# Patient Record
Sex: Male | Born: 1985
Health system: Southern US, Community
[De-identification: ages and names within clinical notes are randomized; demographics above are authoritative.]

## PROBLEM LIST (undated history)

## (undated) DIAGNOSIS — K5792 Diverticulitis of intestine, part unspecified, without perforation or abscess without bleeding: Secondary | ICD-10-CM

## (undated) HISTORY — PX: NO PAST SURGERIES: SHX2092

## (undated) HISTORY — PX: NOSE SURGERY: SHX723

---
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
## 2007-04-19 ENCOUNTER — Emergency Department (HOSPITAL_COMMUNITY): Admission: EM | Admit: 2007-04-19 | Discharge: 2007-04-19 | Payer: Self-pay | Admitting: Emergency Medicine

## 2010-11-10 NOTE — Op Note (Signed)
NAME:  Matthew English, Matthew English NO.:  [id number]   MEDICAL RECORD NO.:  [id number]                   PATIENT TYPE:  AMB   LOCATION:  [facility]                                 FACILITY:  [facility]   PHYSICIAN:  Thyra Breed, MD                      DATE OF BIRTH:  [birth year]   DATE OF PROCEDURE:  01/11/2004  DATE OF DISCHARGE:                                 OPERATIVE REPORT   PREOPERATIVE DIAGNOSIS:  Phimosis.   POSTOPERATIVE DIAGNOSIS:  Phimosis.   OPERATION PERFORMED:  Circumcision.   SURGEON:  Jamison Neighbor, M.D.   RESIDENT SURGEON:  Thyra Breed, MD   ANESTHESIA:  General endotracheal.   COMPLICATIONS:  None.   INDICATIONS FOR PROCEDURE:  The patient is a 25 year old male who was  recently seen in June of 2005 for severe paraphimosis requiring reduction in  the office with a penile block.  The patient is at this time unable to  retract his foreskin and is having discomfort with urination.  Therefore he  has been counseled on the risks, benefits and alternatives of undergoing a  circumcision.  Informed consent has been obtained.   DESCRIPTION OF PROCEDURE:  Following identification by his arm bracelet, the  patient was brought to the operating room and placed in supine position.  He  received preoperative intravenous antibiotics and underwent successful  induction of general endotracheal anesthesia.  His genitalia were then  prepped with Betadine and then draped in the usual sterile fashion.  The  phimotic foreskin could initially not be reduced.  Therefore, a hemostat was  placed in the dorsal midline of the foreskin and incised to create a dorsal  slit.  This allowed delivery of the glans and the first circumcising  incision was then made approximately 5 to 8 mm below the corona of the  glans.  Once this was complete, the foreskin was then reapproximated over  the glans and then a second incision made to allow sufficient length of the  penile shaft skin  once the shaft skin was reapproximated.  We then placed  four hemostats on the intervening piece of penile shaft skin.  Metzenbaum  scissors were used to sharply incise the skin bridge.  Bovie electrocautery  was then used to remove the intervening piece of skin in its entirety.  We  then used the Bovie to obtain excellent hemostasis on the penile shaft.  Once this was complete, we used 4-0 chromic suture to reapproximate the  dorsal midline.  We then placed a U-shaped holding stitch to reapproximate  the ventral midline shaft skin to the frenulum.  We then performed  frenuloplasty, reapproximating the frenulum using a running 4-0 chromic  suture.  Following this we placed two additional 4-0 chromic interrupted  sutures to reapproximate the lateral aspects of the circumcising incision.  The intervening areas of the reapproximation were closed using running 4-0  chromic suture.  The incision was then washed and dried.  20 mL of 0.25%  Marcaine were used as a penile block.  We then placed Xeroform gauze on the  circumferential suture line.  This was completed with a rolling 4 x 4  covered by Coban dressing.  The patient tolerated the procedure well.  There  were no complications.  Please note that Dr. Logan Bores was present and  participated in the entire procedure as he was the responsible surgeon.   DISPOSITION:  After waking from general anesthesia, the patient was  transported to the post anesthesia care unit in stable condition.                                               Thyra Breed, MD    EG/MEDQ  D:  01/11/2004  T:  01/11/2004  Job:  [id number]

## 2013-11-06 ENCOUNTER — Emergency Department (HOSPITAL_COMMUNITY): Payer: No Typology Code available for payment source

## 2013-11-06 ENCOUNTER — Emergency Department (HOSPITAL_COMMUNITY)
Admission: EM | Admit: 2013-11-06 | Discharge: 2013-11-06 | Disposition: A | Discharge status: Home / Self Care | Payer: No Typology Code available for payment source | Attending: Emergency Medicine | Admitting: Emergency Medicine

## 2013-11-06 ENCOUNTER — Encounter (HOSPITAL_COMMUNITY): Payer: Self-pay | Admitting: Emergency Medicine

## 2013-11-06 ENCOUNTER — Emergency Department (HOSPITAL_COMMUNITY)
Admission: EM | Admit: 2013-11-06 | Discharge: 2013-11-06 | Disposition: A | Discharge status: Home / Self Care | Payer: No Typology Code available for payment source | Source: Home / Self Care | Attending: Emergency Medicine | Admitting: Emergency Medicine

## 2013-11-06 DIAGNOSIS — Y92410 Unspecified street and highway as the place of occurrence of the external cause: Secondary | ICD-10-CM | POA: Insufficient documentation

## 2013-11-06 DIAGNOSIS — Y9389 Activity, other specified: Secondary | ICD-10-CM | POA: Insufficient documentation

## 2013-11-06 DIAGNOSIS — R42 Dizziness and giddiness: Secondary | ICD-10-CM | POA: Insufficient documentation

## 2013-11-06 DIAGNOSIS — S0990XA Unspecified injury of head, initial encounter: Secondary | ICD-10-CM | POA: Insufficient documentation

## 2013-11-06 MED ORDER — NAPROXEN 500 MG PO TABS: 500.0000 mg | ORAL_TABLET | Freq: Two times a day (BID) | ORAL | Status: DC

## 2013-11-06 MED ORDER — HYDROCODONE-ACETAMINOPHEN 5-325 MG PO TABS: 1.0000 | ORAL_TABLET | Freq: Four times a day (QID) | ORAL | Status: DC | PRN
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
## 2013-11-07 NOTE — ED Provider Notes (Signed)
Medical screening examination/treatment/procedure(s) were performed by non-physician practitioner and as supervising physician I was immediately available for consultation/collaboration.  Flint MelterElliott L Saivon Prowse, MD 11/07/13 [phone]

## 2013-11-09 NOTE — ED Provider Notes (Signed)
Medical screening examination/treatment/procedure(s) were performed by non-physician practitioner and as supervising physician I was immediately available for consultation/collaboration.   EKG Interpretation None       Doug SouSam Donevan Biller, MD 11/09/13 [phone]

## 2015-10-16 ENCOUNTER — Emergency Department (HOSPITAL_COMMUNITY): Payer: No Typology Code available for payment source

## 2015-10-16 ENCOUNTER — Encounter (HOSPITAL_COMMUNITY): Payer: Self-pay | Admitting: Emergency Medicine

## 2015-10-16 ENCOUNTER — Observation Stay (HOSPITAL_COMMUNITY)
Admission: EM | Admit: 2015-10-16 | Discharge: 2015-10-18 | Disposition: A | Discharge status: Home / Self Care | Payer: No Typology Code available for payment source | Attending: General Surgery | Admitting: General Surgery

## 2015-10-16 DIAGNOSIS — M79641 Pain in right hand: Secondary | ICD-10-CM | POA: Insufficient documentation

## 2015-10-16 DIAGNOSIS — S2231XA Fracture of one rib, right side, initial encounter for closed fracture: Secondary | ICD-10-CM | POA: Diagnosis not present

## 2015-10-16 DIAGNOSIS — S022XXA Fracture of nasal bones, initial encounter for closed fracture: Secondary | ICD-10-CM | POA: Insufficient documentation

## 2015-10-16 DIAGNOSIS — S0181XA Laceration without foreign body of other part of head, initial encounter: Secondary | ICD-10-CM

## 2015-10-16 DIAGNOSIS — S02401A Maxillary fracture, unspecified, initial encounter for closed fracture: Secondary | ICD-10-CM | POA: Insufficient documentation

## 2015-10-16 DIAGNOSIS — S0292XA Unspecified fracture of facial bones, initial encounter for closed fracture: Secondary | ICD-10-CM

## 2015-10-16 DIAGNOSIS — S0121XA Laceration without foreign body of nose, initial encounter: Secondary | ICD-10-CM | POA: Diagnosis not present

## 2015-10-16 DIAGNOSIS — M899 Disorder of bone, unspecified: Secondary | ICD-10-CM | POA: Insufficient documentation

## 2015-10-16 DIAGNOSIS — F10129 Alcohol abuse with intoxication, unspecified: Secondary | ICD-10-CM | POA: Insufficient documentation

## 2015-10-16 DIAGNOSIS — S0992XA Unspecified injury of nose, initial encounter: Secondary | ICD-10-CM | POA: Diagnosis present

## 2015-10-16 DIAGNOSIS — S301XXA Contusion of abdominal wall, initial encounter: Secondary | ICD-10-CM | POA: Insufficient documentation

## 2015-10-16 DIAGNOSIS — F10920 Alcohol use, unspecified with intoxication, uncomplicated: Secondary | ICD-10-CM

## 2015-10-16 DIAGNOSIS — F10929 Alcohol use, unspecified with intoxication, unspecified: Secondary | ICD-10-CM | POA: Diagnosis present

## 2015-10-16 LAB — COMPREHENSIVE METABOLIC PANEL
ALT: 49 U/L (ref 17–63)
AST: 8 U/L — ABNORMAL LOW (ref 15–41)
Albumin: 3.8 g/dL (ref 3.5–5.0)
Alkaline Phosphatase: 56 U/L (ref 38–126)
Anion gap: 11 (ref 5–15)
BUN: 14 mg/dL (ref 6–20)
CALCIUM: 9 mg/dL (ref 8.9–10.3)
CO2: 23 mmol/L (ref 22–32)
CREATININE: 1.15 mg/dL (ref 0.61–1.24)
Chloride: 108 mmol/L (ref 101–111)
Glucose, Bld: 99 mg/dL (ref 65–99)
Potassium: 3.7 mmol/L (ref 3.5–5.1)
Sodium: 142 mmol/L (ref 135–145)
TOTAL PROTEIN: 6.7 g/dL (ref 6.5–8.1)
Total Bilirubin: 0.5 mg/dL (ref 0.3–1.2)

## 2015-10-16 LAB — CBC WITH DIFFERENTIAL/PLATELET
BASOS PCT: 0 %
Basophils Absolute: 0 10*3/uL (ref 0.0–0.1)
EOS ABS: 0.1 10*3/uL (ref 0.0–0.7)
Eosinophils Relative: 2 %
HEMATOCRIT: 40.5 % (ref 39.0–52.0)
Hemoglobin: 13.2 g/dL (ref 13.0–17.0)
Lymphocytes Relative: 39 %
Lymphs Abs: 2.4 10*3/uL (ref 0.7–4.0)
MCH: 27.3 pg (ref 26.0–34.0)
MCHC: 32.6 g/dL (ref 30.0–36.0)
MCV: 83.9 fL (ref 78.0–100.0)
MONO ABS: 0.2 10*3/uL (ref 0.1–1.0)
MONOS PCT: 4 %
Neutro Abs: 3.4 10*3/uL (ref 1.7–7.7)
Neutrophils Relative %: 55 %
Platelets: 198 10*3/uL (ref 150–400)
RBC: 4.83 MIL/uL (ref 4.22–5.81)
RDW: 12.6 % (ref 11.5–15.5)
WBC: 6.2 10*3/uL (ref 4.0–10.5)

## 2015-10-16 LAB — CREATININE, SERUM
CREATININE: 0.93 mg/dL (ref 0.61–1.24)
GFR calc Af Amer: >60 mL/min (ref >60)

## 2015-10-16 LAB — RAPID URINE DRUG SCREEN, HOSP PERFORMED
Amphetamines: NOT DETECTED
BARBITURATES: NOT DETECTED
Benzodiazepines: NOT DETECTED
Cocaine: NOT DETECTED
OPIATES: POSITIVE — AB
Tetrahydrocannabinol: POSITIVE — AB

## 2015-10-16 LAB — CBC
HCT: 39.6 % (ref 39.0–52.0)
Hemoglobin: 13 g/dL (ref 13.0–17.0)
MCH: 27.6 pg (ref 26.0–34.0)
MCHC: 32.8 g/dL (ref 30.0–36.0)
MCV: 84.1 fL (ref 78.0–100.0)
Platelets: 188 10*3/uL (ref 150–400)
RBC: 4.71 MIL/uL (ref 4.22–5.81)
RDW: 12.6 % (ref 11.5–15.5)
WBC: 7.6 10*3/uL (ref 4.0–10.5)

## 2015-10-16 LAB — ETHANOL: ALCOHOL ETHYL (B): 117 mg/dL — AB (ref <5)

## 2015-10-16 LAB — APTT: aPTT: 27 seconds (ref 24–37)

## 2015-10-16 LAB — PROTIME-INR
INR: 1.17 (ref 0.00–1.49)
Prothrombin Time: 15.1 seconds (ref 11.6–15.2)

## 2015-10-16 MED ORDER — ENOXAPARIN SODIUM 40 MG/0.4ML ~~LOC~~ SOLN
40.0000 mg | SUBCUTANEOUS | Status: DC
  Administered 2015-10-17 – 2015-10-18 (×2): 40 mg via SUBCUTANEOUS
  Filled 2015-10-16 (×2): qty 0.4

## 2015-10-16 MED ORDER — TETANUS-DIPHTH-ACELL PERTUSSIS 5-2.5-18.5 LF-MCG/0.5 IM SUSP
0.5000 mL | Freq: Once | INTRAMUSCULAR | Status: AC
  Administered 2015-10-16: 0.5 mL via INTRAMUSCULAR
  Filled 2015-10-16: qty 0.5

## 2015-10-16 MED ORDER — ACETAMINOPHEN 325 MG PO TABS: 650.0000 mg | ORAL_TABLET | ORAL | Status: DC | PRN

## 2015-10-16 MED ORDER — PANTOPRAZOLE SODIUM 40 MG IV SOLR
40.0000 mg | Freq: Every day | INTRAVENOUS | Status: DC
  Filled 2015-10-16: qty 40

## 2015-10-16 MED ORDER — MORPHINE SULFATE (PF) 4 MG/ML IV SOLN
4.0000 mg | Freq: Once | INTRAVENOUS | Status: AC
  Administered 2015-10-16: 4 mg via INTRAVENOUS
  Filled 2015-10-16: qty 1

## 2015-10-16 MED ORDER — SODIUM CHLORIDE 0.9 % IV BOLUS (SEPSIS)
1000.0000 mL | Freq: Once | INTRAVENOUS | Status: AC
  Administered 2015-10-16: 1000 mL via INTRAVENOUS

## 2015-10-16 MED ORDER — PANTOPRAZOLE SODIUM 40 MG PO TBEC
40.0000 mg | DELAYED_RELEASE_TABLET | Freq: Every day | ORAL | Status: DC
  Administered 2015-10-16 – 2015-10-18 (×3): 40 mg via ORAL
  Filled 2015-10-16 (×3): qty 1

## 2015-10-16 MED ORDER — KCL IN DEXTROSE-NACL 20-5-0.45 MEQ/L-%-% IV SOLN
INTRAVENOUS | Status: DC
  Administered 2015-10-16: 14:00:00 via INTRAVENOUS
  Filled 2015-10-16 (×2): qty 1000

## 2015-10-16 MED ORDER — IOPAMIDOL (ISOVUE-300) INJECTION 61%
100.0000 mL | Freq: Once | INTRAVENOUS | Status: AC | PRN
  Administered 2015-10-16: 100 mL via INTRAVENOUS

## 2015-10-16 MED ORDER — KETOROLAC TROMETHAMINE 30 MG/ML IJ SOLN
30.0000 mg | Freq: Three times a day (TID) | INTRAMUSCULAR | Status: DC | PRN
  Administered 2015-10-16 – 2015-10-17 (×2): 30 mg via INTRAVENOUS
  Filled 2015-10-16 (×2): qty 1

## 2015-10-16 MED ORDER — ONDANSETRON HCL 4 MG/2ML IJ SOLN
4.0000 mg | Freq: Once | INTRAMUSCULAR | Status: AC
  Administered 2015-10-16: 4 mg via INTRAVENOUS
  Filled 2015-10-16: qty 2

## 2015-10-16 MED ORDER — LIDOCAINE-EPINEPHRINE 1 %-1:100000 IJ SOLN
10.0000 mL | Freq: Once | INTRAMUSCULAR | Status: AC
  Administered 2015-10-16: 10 mL
  Filled 2015-10-16: qty 1

## 2015-10-16 MED ORDER — BISACODYL 10 MG RE SUPP: 10.0000 mg | Freq: Every day | RECTAL | Status: DC | PRN

## 2015-10-16 MED ORDER — DOCUSATE SODIUM 100 MG PO CAPS
100.0000 mg | ORAL_CAPSULE | Freq: Two times a day (BID) | ORAL | Status: DC
  Administered 2015-10-16 – 2015-10-18 (×4): 100 mg via ORAL
  Filled 2015-10-16 (×5): qty 1

## 2015-10-16 MED ORDER — ONDANSETRON HCL 4 MG/2ML IJ SOLN
4.0000 mg | Freq: Four times a day (QID) | INTRAMUSCULAR | Status: DC | PRN
  Administered 2015-10-17: 4 mg via INTRAVENOUS
  Filled 2015-10-16: qty 2

## 2015-10-16 MED ORDER — ONDANSETRON HCL 4 MG PO TABS: 4.0000 mg | ORAL_TABLET | Freq: Four times a day (QID) | ORAL | Status: DC | PRN

## 2015-10-16 MED ORDER — IOPAMIDOL (ISOVUE-300) INJECTION 61%
INTRAVENOUS | Status: AC
  Filled 2015-10-16: qty 100

## 2015-10-16 MED ORDER — OXYCODONE HCL 5 MG PO TABS
5.0000 mg | ORAL_TABLET | ORAL | Status: DC | PRN
  Administered 2015-10-16 – 2015-10-18 (×6): 10 mg via ORAL
  Filled 2015-10-16 (×8): qty 2

## 2015-10-16 MED ORDER — MORPHINE SULFATE (PF) 2 MG/ML IV SOLN
1.0000 mg | INTRAVENOUS | Status: DC | PRN
  Filled 2015-10-16: qty 1
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
## 2015-10-16 NOTE — ED Notes (Signed)
Pt c/o soreness on this left hand, increase pain whit palpation, MD to be notified.

## 2015-10-16 NOTE — ED Notes (Signed)
To be transported upstairs by Daphine DeutscherMartin, EMT

## 2015-10-16 NOTE — ED Notes (Signed)
MD Byers at the bedside.  

## 2015-10-16 NOTE — ED Notes (Signed)
Attempted Report x1.   

## 2015-10-16 NOTE — ED Notes (Signed)
Called Xray to transport patient.  

## 2015-10-16 NOTE — ED Notes (Signed)
Patient transported to X-ray 
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
## 2015-10-17 LAB — BASIC METABOLIC PANEL
Anion gap: 11 (ref 5–15)
BUN: 12 mg/dL (ref 6–20)
CHLORIDE: 104 mmol/L (ref 101–111)
CO2: 24 mmol/L (ref 22–32)
CREATININE: 1.06 mg/dL (ref 0.61–1.24)
Calcium: 8.8 mg/dL — ABNORMAL LOW (ref 8.9–10.3)
GFR calc Af Amer: >60 mL/min (ref >60)
GFR calc non Af Amer: >60 mL/min (ref >60)
GLUCOSE: 100 mg/dL — AB (ref 65–99)
POTASSIUM: 3.7 mmol/L (ref 3.5–5.1)
Sodium: 139 mmol/L (ref 135–145)

## 2015-10-17 LAB — SAMPLE TO BLOOD BANK

## 2015-10-17 LAB — CBC
HCT: 38.4 % — ABNORMAL LOW (ref 39.0–52.0)
HEMOGLOBIN: 12.6 g/dL — AB (ref 13.0–17.0)
MCH: 27.7 pg (ref 26.0–34.0)
MCHC: 32.8 g/dL (ref 30.0–36.0)
MCV: 84.4 fL (ref 78.0–100.0)
PLATELETS: 157 10*3/uL (ref 150–400)
RBC: 4.55 MIL/uL (ref 4.22–5.81)
RDW: 12.8 % (ref 11.5–15.5)
WBC: 5.1 10*3/uL (ref 4.0–10.5)

## 2015-10-17 MED ORDER — SALINE SPRAY 0.65 % NA SOLN
1.0000 | Freq: Two times a day (BID) | NASAL | Status: DC
  Administered 2015-10-17 – 2015-10-18 (×3): 1 via NASAL
  Filled 2015-10-17: qty 44
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
## 2015-10-18 ENCOUNTER — Encounter: Payer: Self-pay | Admitting: Orthopedic Surgery

## 2015-10-18 DIAGNOSIS — S2231XA Fracture of one rib, right side, initial encounter for closed fracture: Secondary | ICD-10-CM | POA: Diagnosis present

## 2015-10-18 DIAGNOSIS — S0181XA Laceration without foreign body of other part of head, initial encounter: Secondary | ICD-10-CM | POA: Diagnosis present

## 2015-10-18 DIAGNOSIS — S301XXA Contusion of abdominal wall, initial encounter: Secondary | ICD-10-CM | POA: Diagnosis present

## 2015-10-18 DIAGNOSIS — S0292XA Unspecified fracture of facial bones, initial encounter for closed fracture: Secondary | ICD-10-CM | POA: Diagnosis present

## 2015-10-18 DIAGNOSIS — F10929 Alcohol use, unspecified with intoxication, unspecified: Secondary | ICD-10-CM | POA: Diagnosis present

## 2015-10-18 MED ORDER — OXYCODONE-ACETAMINOPHEN 5-325 MG PO TABS: 1.0000 | ORAL_TABLET | ORAL | Status: DC | PRN

## 2015-10-18 MED ORDER — NAPROXEN 500 MG PO TABS: 500.0000 mg | ORAL_TABLET | Freq: Two times a day (BID) | ORAL | Status: DC

## 2015-10-18 MED ORDER — CEPHALEXIN 250 MG PO CAPS: 250.0000 mg | ORAL_CAPSULE | Freq: Four times a day (QID) | ORAL | Status: AC

## 2015-10-18 NOTE — Discharge Instructions (Signed)
No driving while taking oxycodone.  Use saline nasal spray (over-the-counter) in both nostrils.
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
## 2015-10-18 NOTE — Progress Notes (Signed)
Discharge home. Home discharge instruction given, no question verbalized. 
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
## 2016-12-06 ENCOUNTER — Emergency Department (HOSPITAL_COMMUNITY): Payer: Self-pay

## 2016-12-06 ENCOUNTER — Encounter (HOSPITAL_COMMUNITY): Payer: Self-pay | Admitting: *Deleted

## 2016-12-06 ENCOUNTER — Inpatient Hospital Stay (HOSPITAL_COMMUNITY)
Admission: EM | Admit: 2016-12-06 | Discharge: 2016-12-09 | DRG: 392 | Disposition: A | Discharge status: Home / Self Care | Payer: Self-pay | Attending: General Surgery | Admitting: General Surgery

## 2016-12-06 DIAGNOSIS — K5720 Diverticulitis of large intestine with perforation and abscess without bleeding: Principal | ICD-10-CM | POA: Diagnosis present

## 2016-12-06 DIAGNOSIS — K529 Noninfective gastroenteritis and colitis, unspecified: Secondary | ICD-10-CM | POA: Diagnosis present

## 2016-12-06 DIAGNOSIS — R300 Dysuria: Secondary | ICD-10-CM | POA: Diagnosis present

## 2016-12-06 DIAGNOSIS — K5900 Constipation, unspecified: Secondary | ICD-10-CM | POA: Diagnosis present

## 2016-12-06 HISTORY — DX: Diverticulitis of intestine, part unspecified, without perforation or abscess without bleeding: K57.92

## 2016-12-06 LAB — CBC
HEMATOCRIT: 41.5 % (ref 39.0–52.0)
Hemoglobin: 13.5 g/dL (ref 13.0–17.0)
MCH: 28 pg (ref 26.0–34.0)
MCHC: 32.5 g/dL (ref 30.0–36.0)
MCV: 86.1 fL (ref 78.0–100.0)
Platelets: 232 10*3/uL (ref 150–400)
RBC: 4.82 MIL/uL (ref 4.22–5.81)
RDW: 12.8 % (ref 11.5–15.5)
WBC: 6.8 10*3/uL (ref 4.0–10.5)

## 2016-12-06 LAB — COMPREHENSIVE METABOLIC PANEL
ALBUMIN: 3.8 g/dL (ref 3.5–5.0)
ALT: 32 U/L (ref 17–63)
AST: 17 U/L (ref 15–41)
Alkaline Phosphatase: 61 U/L (ref 38–126)
Anion gap: 8 (ref 5–15)
BUN: 13 mg/dL (ref 6–20)
CHLORIDE: 104 mmol/L (ref 101–111)
CO2: 27 mmol/L (ref 22–32)
Calcium: 9.1 mg/dL (ref 8.9–10.3)
Creatinine, Ser: 1 mg/dL (ref 0.61–1.24)
GFR calc Af Amer: >60 mL/min (ref >60)
GFR calc non Af Amer: >60 mL/min (ref >60)
GLUCOSE: 96 mg/dL (ref 65–99)
POTASSIUM: 4.1 mmol/L (ref 3.5–5.1)
SODIUM: 139 mmol/L (ref 135–145)
Total Bilirubin: 0.5 mg/dL (ref 0.3–1.2)
Total Protein: 6.8 g/dL (ref 6.5–8.1)

## 2016-12-06 LAB — URINALYSIS, ROUTINE W REFLEX MICROSCOPIC
Bilirubin Urine: NEGATIVE
GLUCOSE, UA: NEGATIVE mg/dL
Hgb urine dipstick: NEGATIVE
KETONES UR: NEGATIVE mg/dL
LEUKOCYTES UA: NEGATIVE
Nitrite: NEGATIVE
PH: 6 (ref 5.0–8.0)
Protein, ur: NEGATIVE mg/dL
Specific Gravity, Urine: 1.029 (ref 1.005–1.030)

## 2016-12-06 LAB — LIPASE, BLOOD: LIPASE: 29 U/L (ref 11–51)

## 2016-12-06 MED ORDER — ACETAMINOPHEN 325 MG PO TABS
650.0000 mg | ORAL_TABLET | Freq: Four times a day (QID) | ORAL | Status: DC | PRN
  Administered 2016-12-06 – 2016-12-07 (×3): 650 mg via ORAL
  Filled 2016-12-06 (×4): qty 2

## 2016-12-06 MED ORDER — IOPAMIDOL (ISOVUE-300) INJECTION 61%
INTRAVENOUS | Status: AC
  Administered 2016-12-06: 100 mL
  Filled 2016-12-06: qty 100

## 2016-12-06 MED ORDER — HYDRALAZINE HCL 20 MG/ML IJ SOLN: 10.0000 mg | INTRAMUSCULAR | Status: DC | PRN

## 2016-12-06 MED ORDER — METRONIDAZOLE IN NACL 5-0.79 MG/ML-% IV SOLN
500.0000 mg | Freq: Once | INTRAVENOUS | Status: AC
  Administered 2016-12-06: 500 mg via INTRAVENOUS
  Filled 2016-12-06: qty 100

## 2016-12-06 MED ORDER — ONDANSETRON 4 MG PO TBDP: 4.0000 mg | ORAL_TABLET | Freq: Four times a day (QID) | ORAL | Status: DC | PRN

## 2016-12-06 MED ORDER — ZOLPIDEM TARTRATE 5 MG PO TABS: 5.0000 mg | ORAL_TABLET | Freq: Every evening | ORAL | Status: DC | PRN

## 2016-12-06 MED ORDER — ACETAMINOPHEN 650 MG RE SUPP: 650.0000 mg | Freq: Four times a day (QID) | RECTAL | Status: DC | PRN

## 2016-12-06 MED ORDER — KCL IN DEXTROSE-NACL 20-5-0.45 MEQ/L-%-% IV SOLN
INTRAVENOUS | Status: DC
Start: 2016-12-06 — End: 2016-12-09
  Administered 2016-12-06 – 2016-12-08 (×4): via INTRAVENOUS
  Filled 2016-12-06 (×4): qty 1000

## 2016-12-06 MED ORDER — LEVOFLOXACIN IN D5W 750 MG/150ML IV SOLN
750.0000 mg | Freq: Once | INTRAVENOUS | Status: AC
  Administered 2016-12-06: 750 mg via INTRAVENOUS
  Filled 2016-12-06: qty 150

## 2016-12-06 MED ORDER — SIMETHICONE 80 MG PO CHEW
40.0000 mg | CHEWABLE_TABLET | Freq: Four times a day (QID) | ORAL | Status: DC | PRN
  Administered 2016-12-06 – 2016-12-09 (×7): 40 mg via ORAL
  Filled 2016-12-06 (×9): qty 1

## 2016-12-06 MED ORDER — DIPHENHYDRAMINE HCL 50 MG/ML IJ SOLN: 12.5000 mg | Freq: Four times a day (QID) | INTRAMUSCULAR | Status: DC | PRN

## 2016-12-06 MED ORDER — DIPHENHYDRAMINE HCL 12.5 MG/5ML PO ELIX: 12.5000 mg | ORAL_SOLUTION | Freq: Four times a day (QID) | ORAL | Status: DC | PRN

## 2016-12-06 MED ORDER — DEXTROSE 5 % IV SOLN
2.0000 g | INTRAVENOUS | Status: DC
  Administered 2016-12-06 – 2016-12-08 (×3): 2 g via INTRAVENOUS
  Filled 2016-12-06 (×4): qty 2

## 2016-12-06 MED ORDER — MORPHINE SULFATE (PF) 4 MG/ML IV SOLN
1.0000 mg | INTRAVENOUS | Status: DC | PRN
  Administered 2016-12-06 – 2016-12-07 (×3): 1 mg via INTRAVENOUS
  Administered 2016-12-07: 2 mg via INTRAVENOUS
  Administered 2016-12-08: 4 mg via INTRAVENOUS
  Administered 2016-12-08: 2 mg via INTRAVENOUS
  Filled 2016-12-06 (×8): qty 1

## 2016-12-06 MED ORDER — ENOXAPARIN SODIUM 40 MG/0.4ML ~~LOC~~ SOLN
40.0000 mg | SUBCUTANEOUS | Status: DC
  Administered 2016-12-06 – 2016-12-08 (×3): 40 mg via SUBCUTANEOUS
  Filled 2016-12-06 (×3): qty 0.4

## 2016-12-06 MED ORDER — ONDANSETRON HCL 4 MG/2ML IJ SOLN
4.0000 mg | Freq: Four times a day (QID) | INTRAMUSCULAR | Status: DC | PRN
  Administered 2016-12-08 (×2): 4 mg via INTRAVENOUS
  Filled 2016-12-06 (×2): qty 2

## 2016-12-06 MED ORDER — METRONIDAZOLE IN NACL 5-0.79 MG/ML-% IV SOLN
500.0000 mg | Freq: Three times a day (TID) | INTRAVENOUS | Status: DC
  Administered 2016-12-06 – 2016-12-09 (×9): 500 mg via INTRAVENOUS
  Filled 2016-12-06 (×10): qty 100

## 2016-12-06 MED ORDER — METHOCARBAMOL 1000 MG/10ML IJ SOLN: 500.0000 mg | Freq: Three times a day (TID) | INTRAVENOUS | Status: DC | PRN

## 2016-12-06 NOTE — Progress Notes (Signed)
Case reviewed with Dr. Grace IsaacWatts regarding request for possible drain placement in this patient with diverticulitis and an abscess.  Unfortunately this area right now is too small to drain and possible intramural.  We recommend IV abx therapy with a low threshold to repeat a CT scan in several days to evaluate for abscess progression and possible need for drain at that time.  This was relayed to Dr. Donell BeersByerly who was in agreement.  Batoul Limes E 2:21 PM 12/06/2016

## 2016-12-06 NOTE — ED Triage Notes (Signed)
Pt c/o abdominal pain (just below umbilicus) x 1 week with intermittent pain shooting to penis. Denies NV; pain in abd worsens when lying flat. Has tried miralax without improvement
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
## 2016-12-06 NOTE — ED Notes (Signed)
Returned from CT.
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
## 2016-12-07 LAB — CBC
HEMATOCRIT: 39.7 % (ref 39.0–52.0)
HEMOGLOBIN: 12.9 g/dL — AB (ref 13.0–17.0)
MCH: 27.6 pg (ref 26.0–34.0)
MCHC: 32.5 g/dL (ref 30.0–36.0)
MCV: 85 fL (ref 78.0–100.0)
Platelets: 205 10*3/uL (ref 150–400)
RBC: 4.67 MIL/uL (ref 4.22–5.81)
RDW: 12.8 % (ref 11.5–15.5)
WBC: 5.9 10*3/uL (ref 4.0–10.5)

## 2016-12-07 LAB — BASIC METABOLIC PANEL
ANION GAP: 7 (ref 5–15)
BUN: 8 mg/dL (ref 6–20)
CALCIUM: 9 mg/dL (ref 8.9–10.3)
CO2: 26 mmol/L (ref 22–32)
Chloride: 105 mmol/L (ref 101–111)
Creatinine, Ser: 0.9 mg/dL (ref 0.61–1.24)
GFR calc Af Amer: >60 mL/min (ref >60)
Glucose, Bld: 95 mg/dL (ref 65–99)
POTASSIUM: 3.8 mmol/L (ref 3.5–5.1)
SODIUM: 138 mmol/L (ref 135–145)

## 2016-12-07 LAB — HIV ANTIBODY (ROUTINE TESTING W REFLEX): HIV SCREEN 4TH GENERATION: NONREACTIVE

## 2016-12-07 NOTE — Progress Notes (Signed)
Patient ID: Matthew English, male   DOB: 01/16/1986, 31 y.o.   MRN: [id number]  [facility]     Subjective: CC- diverticulitis, abdominal pain Patient states that he is feeling 80% better today. He reports some mild persistent lower abdominal pain, but it is improved from yesterday. Dysuria resolved. Denies n/v.  Objective: Vital signs in last 24 hours: Temp:  [98.4 F (36.9 C)-98.6 F (37 C)] 98.4 F (36.9 C) (06/15 0620) Pulse Rate:  [56-70] 65 (06/15 0620) Resp:  [14-18] 18 (06/15 0620) BP: (103-122)/(63-87) 108/64 (06/15 0620) SpO2:  [98 %-100 %] 100 % (06/15 0620) Weight:  [256 lb 13.4 oz (116.5 kg)] 256 lb 13.4 oz (116.5 kg) (06/14 1500) Last BM Date: 12/05/16  Intake/Output from previous day: 06/14 0701 - 06/15 0700 In: 1163.3 [I.V.:1013.3; IV Piggyback:150] Out: 300 [Urine:300] Intake/Output this shift: No intake/output data recorded.  PE: Gen:  Alert, NAD, pleasant HEENT: EOM's in tact, pupils equal  Card:  RRR, no M/G/R heard Pulm:  CTAB, no W/R/R, effort normal Abd: Soft, ND, minimal lower abdominal TTP, +BS, no HSM, no hernia Ext:  No erythema, edema, or tenderness BUE/BLE  Psych: A&Ox3  Lab Results:   Recent Labs  12/06/16 0600 12/07/16 0827  WBC 6.8 5.9  HGB 13.5 12.9*  HCT 41.5 39.7  PLT 232 205   BMET  Recent Labs  12/06/16 0600 12/07/16 0827  NA 139 138  K 4.1 3.8  CL 104 105  CO2 27 26  GLUCOSE 96 95  BUN 13 8  CREATININE 1.00 0.90  CALCIUM 9.1 9.0   PT/INR No results for input(s): LABPROT, INR in the last 72 hours. CMP     Component Value Date/Time   NA 138 12/07/2016 0827   K 3.8 12/07/2016 0827   CL 105 12/07/2016 0827   CO2 26 12/07/2016 0827   GLUCOSE 95 12/07/2016 0827   BUN 8 12/07/2016 0827   CREATININE 0.90 12/07/2016 0827   CALCIUM 9.0 12/07/2016 0827   PROT 6.8 12/06/2016 0600   ALBUMIN 3.8 12/06/2016 0600   AST 17 12/06/2016 0600   ALT 32 12/06/2016 0600   ALKPHOS 61 12/06/2016 0600    BILITOT 0.5 12/06/2016 0600   GFRNONAA >60 12/07/2016 0827   GFRAA >60 12/07/2016 0827   Lipase     Component Value Date/Time   LIPASE 29 12/06/2016 0600       Studies/Results: Ct Abdomen Pelvis W Contrast  Result Date: 12/06/2016 CLINICAL DATA:  Lower abdominal pain for 1 week EXAM: CT ABDOMEN AND PELVIS WITH CONTRAST TECHNIQUE: Multidetector CT imaging of the abdomen and pelvis was performed using the standard protocol following bolus administration of intravenous contrast. CONTRAST:  100mL ISOVUE-300 IOPAMIDOL (ISOVUE-300) INJECTION 61% COMPARISON:  10/16/2015 FINDINGS: Lower chest:  No contributory findings. Hepatobiliary: No focal liver abnormality.No evidence of biliary obstruction or stone. Pancreas: Unremarkable. Spleen: Unremarkable. Adrenals/Urinary Tract: Negative adrenals. No hydronephrosis or convincing stone (early urinary contrast excretion). Unremarkable bladder. Stomach/Bowel: Focal inflammatory wall thickening at the level of the sigmoid colon with rim enhancing intramural and adjacent mesenteric collection containing fluid and gas, measuring 23 mm. Usually this appearance is from complicated diverticulitis. At least 1 additional diverticulum is seen along the distal transverse colon. No evidence of underlying foreign body. Vascular/Lymphatic: No acute vascular abnormality. No mass or adenopathy. Reproductive:No pathologic findings. Other: Trace reactive fluid.  No pneumoperitoneum. Musculoskeletal: No acute abnormalities. IMPRESSION: Focal sigmoid colitis with intramural and mesenteric 22 mm abscess. Usually, this appearance is from  complicated diverticulitis; at least 1 colonic diverticulum is seen elsewhere. No pneumoperitoneum or drainable collection. Electronically Signed   By: Marnee Spring M.D.   On: 12/06/2016 09:28    Anti-infectives: Anti-infectives    Start     Dose/Rate Route Frequency Ordered Stop   12/06/16 1830  metroNIDAZOLE (FLAGYL) IVPB 500 mg     500  mg 100 mL/hr over 60 Minutes Intravenous Every 8 hours 12/06/16 1324     12/06/16 1400  cefTRIAXone (ROCEPHIN) 2 g in dextrose 5 % 50 mL IVPB     2 g 100 mL/hr over 30 Minutes Intravenous Every 24 hours 12/06/16 1324     12/06/16 1015  levofloxacin (LEVAQUIN) IVPB 750 mg     750 mg 100 mL/hr over 90 Minutes Intravenous  Once 12/06/16 1003 12/06/16 1356   12/06/16 1015  metroNIDAZOLE (FLAGYL) IVPB 500 mg     500 mg 100 mL/hr over 60 Minutes Intravenous  Once 12/06/16 1003 12/06/16 1156       Assessment/Plan Sigmoid diverticulitis with abscess - CT 6/14 showed focal sigmoid colitis with intramural and mesenteric 22 mm abscess - abscess too small for per drain - patient is on rocephin/flagyl  ID - rocephin/flagyl 6/14>> FEN - IVF, clear liquids VTE - SCDs, lovenox  Plan - advance to clear liquids. Continue IV antibiotics.   LOS: 1 day    Edson Snowball , [facility] 12/07/2016, 9:52 AM Pager: [phone] Consults: [phone] Mon-Fri 7:00 am-4:30 pm Sat-Sun 7:00 am-11:30 am

## 2016-12-08 LAB — CBC
HEMATOCRIT: 41.1 % (ref 39.0–52.0)
HEMOGLOBIN: 13.4 g/dL (ref 13.0–17.0)
MCH: 27.6 pg (ref 26.0–34.0)
MCHC: 32.6 g/dL (ref 30.0–36.0)
MCV: 84.7 fL (ref 78.0–100.0)
Platelets: 221 10*3/uL (ref 150–400)
RBC: 4.85 MIL/uL (ref 4.22–5.81)
RDW: 12.5 % (ref 11.5–15.5)
WBC: 7.1 10*3/uL (ref 4.0–10.5)

## 2016-12-08 LAB — BASIC METABOLIC PANEL
ANION GAP: 6 (ref 5–15)
BUN: 6 mg/dL (ref 6–20)
CO2: 27 mmol/L (ref 22–32)
Calcium: 9 mg/dL (ref 8.9–10.3)
Chloride: 102 mmol/L (ref 101–111)
Creatinine, Ser: 1.1 mg/dL (ref 0.61–1.24)
GLUCOSE: 99 mg/dL (ref 65–99)
POTASSIUM: 3.7 mmol/L (ref 3.5–5.1)
Sodium: 135 mmol/L (ref 135–145)

## 2016-12-08 NOTE — Progress Notes (Signed)
Patient ID: Matthew English, male   DOB: [birth year], 31 y.o.   MRN: [id number]  [facility] Progress Note     Subjective: CC- diverticulitis, abdominal pain Pt is doing great.  Walked a LOT of labs this AM.  No pain.  Having normal BMs again.  Objective: Vital signs in last 24 hours: Temp:  [98.1 F (36.7 C)-99 F (37.2 C)] 99 F (37.2 C) (06/16 0502) Pulse Rate:  [63-79] 72 (06/16 0502) Resp:  [18] 18 (06/16 0502) BP: (105-118)/(67-89) 105/67 (06/16 0502) SpO2:  [99 %-100 %] 100 % (06/16 0502) Last BM Date: 12/07/16  Intake/Output from previous day: 06/15 0701 - 06/16 0700 In: 2715.9 [P.O.:478; I.V.:1887.9; IV Piggyback:350] Out: -  Intake/Output this shift: No intake/output data recorded.  PE: Gen:  Alert, NAD, pleasant HEENT: EOM's in tact, pupils equal  Card:  regular Pulm:  effort normal, no distress Abd: Soft, ND, non tender Ext:  No erythema, edema, or tenderness BUE/BLE  Psych: A&Ox3  Lab Results:   Recent Labs  12/07/16 0827 12/08/16 0454  WBC 5.9 7.1  HGB 12.9* 13.4  HCT 39.7 41.1  PLT 205 221   BMET  Recent Labs  12/07/16 0827 12/08/16 0454  NA 138 135  K 3.8 3.7  CL 105 102  CO2 26 27  GLUCOSE 95 99  BUN 8 6  CREATININE 0.90 1.10  CALCIUM 9.0 9.0   PT/INR No results for input(s): LABPROT, INR in the last 72 hours. CMP     Component Value Date/Time   NA 135 12/08/2016 0454   K 3.7 12/08/2016 0454   CL 102 12/08/2016 0454   CO2 27 12/08/2016 0454   GLUCOSE 99 12/08/2016 0454   BUN 6 12/08/2016 0454   CREATININE 1.10 12/08/2016 0454   CALCIUM 9.0 12/08/2016 0454   PROT 6.8 12/06/2016 0600   ALBUMIN 3.8 12/06/2016 0600   AST 17 12/06/2016 0600   ALT 32 12/06/2016 0600   ALKPHOS 61 12/06/2016 0600   BILITOT 0.5 12/06/2016 0600   GFRNONAA >60 12/08/2016 0454   GFRAA >60 12/08/2016 0454   Lipase     Component Value Date/Time   LIPASE 29 12/06/2016 0600       Studies/Results: Ct Abdomen Pelvis W  Contrast  Result Date: 12/06/2016 CLINICAL DATA:  Lower abdominal pain for 1 week EXAM: CT ABDOMEN AND PELVIS WITH CONTRAST TECHNIQUE: Multidetector CT imaging of the abdomen and pelvis was performed using the standard protocol following bolus administration of intravenous contrast. CONTRAST:  ISOVUE-300 IOPAMIDOL (ISOVUE-300) INJECTION 61% COMPARISON:  10/16/2015 FINDINGS: Lower chest:  No contributory findings. Hepatobiliary: No focal liver abnormality.No evidence of biliary obstruction or stone. Pancreas: Unremarkable. Spleen: Unremarkable. Adrenals/Urinary Tract: Negative adrenals. No hydronephrosis or convincing stone (early urinary contrast excretion). Unremarkable bladder. Stomach/Bowel: Focal inflammatory wall thickening at the level of the sigmoid colon with rim enhancing intramural and adjacent mesenteric collection containing fluid and gas, measuring 23 mm. Usually this appearance is from complicated diverticulitis. At least 1 additional diverticulum is seen along the distal transverse colon. No evidence of underlying foreign body. Vascular/Lymphatic: No acute vascular abnormality. No mass or adenopathy. Reproductive:No pathologic findings. Other: Trace reactive fluid.  No pneumoperitoneum. Musculoskeletal: No acute abnormalities. IMPRESSION: Focal sigmoid colitis with intramural and mesenteric 22 mm abscess. Usually, this appearance is from complicated diverticulitis; at least 1 colonic diverticulum is seen elsewhere. No pneumoperitoneum or drainable collection. Electronically Signed   By: Marnee Spring M.D.   On: 12/06/2016 09:28    Anti-infectives:  Anti-infectives    Start     Dose/Rate Route Frequency Ordered Stop   12/06/16 1830  metroNIDAZOLE (FLAGYL) IVPB 500 mg     500 mg 100 mL/hr over 60 Minutes Intravenous Every 8 hours 12/06/16 1324     12/06/16 1400  cefTRIAXone (ROCEPHIN) 2 g in dextrose 5 % 50 mL IVPB     2 g 100 mL/hr over 30 Minutes Intravenous Every 24 hours 12/06/16  1324     12/06/16 1015  levofloxacin (LEVAQUIN) IVPB 750 mg     750 mg 100 mL/hr over 90 Minutes Intravenous  Once 12/06/16 1003 12/06/16 1356   12/06/16 1015  metroNIDAZOLE (FLAGYL) IVPB 500 mg     500 mg 100 mL/hr over 60 Minutes Intravenous  Once 12/06/16 1003 12/06/16 1156       Assessment/Plan Sigmoid diverticulitis with abscess - CT 6/14 showed focal sigmoid colitis with intramural and mesenteric 22 mm abscess - abscess too small for per drain  ID - rocephin/flagyl 6/14>> FEN -KVO IVF.  Advance diet today.   VTE - SCDs, lovenox  Plan -Probable discharge tomorrow AM as long as eating does not alter symptoms.     LOS: 2 days    Almond Lint , MD [facility] 12/08/2016, 8:59 AM

## 2016-12-08 NOTE — Discharge Instructions (Signed)
Diverticulitis Diverticulitis is inflammation or infection of small pouches in your colon that form when you have a condition called diverticulosis. The pouches in your colon are called diverticula. Your colon, or large intestine, is where water is absorbed and stool is formed. Complications of diverticulitis can include:  Bleeding.  Severe infection.  Severe pain.  Perforation of your colon.  Obstruction of your colon.  What are the causes? Diverticulitis is caused by bacteria. Diverticulitis happens when stool becomes trapped in diverticula. This allows bacteria to grow in the diverticula, which can lead to inflammation and infection. What increases the risk? People with diverticulosis are at risk for diverticulitis. Eating a diet that does not include enough fiber from fruits and vegetables may make diverticulitis more likely to develop. What are the signs or symptoms? Symptoms of diverticulitis may include:  Abdominal pain and tenderness. The pain is normally located on the left side of the abdomen, but may occur in other areas.  Fever and chills.  Bloating.  Cramping.  Nausea.  Vomiting.  Constipation.  Diarrhea.  Blood in your stool.  How is this diagnosed? Your health care provider will ask you about your medical history and do a physical exam. You may need to have tests done because many medical conditions can cause the same symptoms as diverticulitis. Tests may include:  Blood tests.  Urine tests.  Imaging tests of the abdomen, including X-rays and CT scans.  When your condition is under control, your health care provider may recommend that you have a colonoscopy. A colonoscopy can show how severe your diverticula are and whether something else is causing your symptoms. How is this treated? Most cases of diverticulitis are mild and can be treated at home. Treatment may include:  Taking over-the-counter pain medicines.  Following a clear liquid  diet.  Taking antibiotic medicines by mouth for 7-10 days.  More severe cases may be treated at a hospital. Treatment may include:  Not eating or drinking.  Taking prescription pain medicine.  Receiving antibiotic medicines through an IV tube.  Receiving fluids and nutrition through an IV tube.  Surgery.  Follow these instructions at home:  Follow your health care providers instructions carefully.  Follow a full liquid diet or other diet as directed by your health care provider. After your symptoms improve, your health care provider may tell you to change your diet. He or she may recommend you eat a high-fiber diet. Fruits and vegetables are good sources of fiber. Fiber makes it easier to pass stool.  Take fiber supplements or probiotics as directed by your health care provider.  Only take medicines as directed by your health care provider.  Keep all your follow-up appointments. Contact a health care provider if:  Your pain does not improve.  You have a hard time eating food.  Your bowel movements do not return to normal. Get help right away if:  Your pain becomes worse.  Your symptoms do not get better.  Your symptoms suddenly get worse.  You have a fever.  You have repeated vomiting.  You have bloody or black, tarry stools. This information is not intended to replace advice given to you by your health care provider. Make sure you discuss any questions you have with your health care provider.   Low-Fiber Diet Fiber is found in fruits, vegetables, and whole grains. A low-fiber diet restricts fibrous foods that are not digested in the small intestine. A diet containing about 10-15 grams of fiber per day is considered  low fiber. Low-fiber diets may be used to:  Promote healing and rest the bowel during intestinal flare-ups.  Prevent blockage of a partially obstructed or narrowed gastrointestinal tract.  Reduce fecal weight and volume.  Slow the movement of  feces.  You may be on a low-fiber diet as a transitional diet following surgery, after an injury (trauma), or because of a short (acute) or lifelong (chronic) illness. Your health care provider will determine the length of time you need to stay on this diet. What do I need to know about a low-fiber diet? Always check the fiber content on the packaging's Nutrition Facts label, especially on foods from the grains list. Ask your dietitian if you have questions about specific foods that are related to your condition, especially if the food is not listed below. In general, a low-fiber food will have less than 2 g of fiber. What foods can I eat? Grains All breads and crackers made with white flour. Sweet rolls, doughnuts, waffles, pancakes, [place] toast, bagels. Pretzels, Melba toast, zwieback. Well-cooked cereals, such as cornmeal, farina, or cream cereals. Dry cereals that do not contain whole grains, fruit, or nuts, such as refined corn, wheat, rice, and oat cereals. Potatoes prepared any way without skins, plain pastas and noodles, refined white rice. Use white flour for baking and making sauces. Use allowed list of grains for casseroles, dumplings, and puddings. Vegetables Strained tomato and vegetable juices. Fresh lettuce, cucumber, spinach. Well-cooked (no skin or pulp) or canned vegetables, such as asparagus, bean sprouts, beets, carrots, green beans, mushrooms, potatoes, pumpkin, spinach, yellow squash, tomato sauce/puree, turnips, yams, and zucchini. Keep servings limited to  cup. Fruits All fruit juices except prune juice. Cooked or canned fruits without skin and seeds, such as applesauce, apricots, cherries, fruit cocktail, grapefruit, grapes, mandarin oranges, melons, peaches, pears, pineapple, and plums. Fresh fruits without skin, such as apricots, avocados, bananas, melons, pineapple, nectarines, and peaches. Keep servings limited to  cup or 1 piece. Meat and Other Protein Sources Ground or  well-cooked tender beef, ham, veal, lamb, pork, or poultry. Eggs, plain cheese. Fish, oysters, shrimp, lobster, and other seafood. Liver, organ meats. Smooth nut butters. Dairy All milk products and alternative dairy substitutes, such as soy, rice, almond, and coconut, not containing added whole nuts, seeds, or added fruit. Beverages Decaf coffee, fruit, and vegetable juices or smoothies (small amounts, with no pulp or skins, and with fruits from allowed list), sports drinks, herbal tea. Condiments Ketchup, mustard, vinegar, cream sauce, cheese sauce, cocoa powder. Spices in moderation, such as allspice, basil, bay leaves, celery powder or leaves, cinnamon, cumin powder, curry powder, ginger, mace, marjoram, onion or garlic powder, oregano, paprika, parsley flakes, ground pepper, rosemary, sage, savory, tarragon, thyme, and turmeric. Sweets and Desserts Plain cakes and cookies, pie made with allowed fruit, pudding, custard, cream pie. Gelatin, fruit, ice, sherbet, frozen ice pops. Ice cream, ice milk without nuts. Plain hard candy, honey, jelly, molasses, syrup, sugar, chocolate syrup, gumdrops, marshmallows. Limit overall sugar intake. Fats and Oil Margarine, butter, cream, mayonnaise, salad oils, plain salad dressings made from allowed foods. Choose healthy fats such as olive oil, canola oil, and omega-3 fatty acids (such as found in salmon or tuna) when possible. Other Bouillon, broth, or cream soups made from allowed foods. Any strained soup. Casseroles or mixed dishes made with allowed foods. The items listed above may not be a complete list of recommended foods or beverages. Contact your dietitian for more options. What foods are not recommended? Grains  All whole wheat and whole grain breads and crackers. Multigrains, rye, bran seeds, nuts, or coconut. Cereals containing whole grains, multigrains, bran, coconut, nuts, raisins. Cooked or dry oatmeal, steel-cut oats. Coarse wheat cereals,  granola. Cereals advertised as high fiber. Potato skins. Whole grain pasta, wild or brown rice. Popcorn. Coconut flour. Bran, buckwheat, corn bread, multigrains, rye, wheat germ. Vegetables Fresh, cooked or canned vegetables, such as artichokes, asparagus, beet greens, broccoli, Brussels sprouts, cabbage, celery, cauliflower, corn, eggplant, kale, legumes or beans, okra, peas, and tomatoes. Avoid large servings of any vegetables, especially raw vegetables. Fruits Fresh fruits, such as apples with or without skin, berries, cherries, figs, grapes, grapefruit, guavas, kiwis, mangoes, oranges, papayas, pears, persimmons, pineapple, and pomegranate. Prune juice and juices with pulp, stewed or dried prunes. Dried fruits, dates, raisins. Fruit seeds or skins. Avoid large servings of all fresh fruits. Meats and Other Protein Sources Tough, fibrous meats with gristle. Chunky nut butter. Cheese made with seeds, nuts, or other foods not recommended. Nuts, seeds, legumes (beans, including baked beans), dried peas, beans, lentils. Dairy Yogurt or cheese that contains nuts, seeds, or added fruit. Beverages Fruit juices with high pulp, prune juice. Caffeinated coffee and teas. Condiments Coconut, maple syrup, pickles, olives. Sweets and Desserts Desserts, cookies, or candies that contain nuts or coconut, chunky peanut butter, dried fruits. Jams, preserves with seeds, marmalade. Large amounts of sugar and sweets. Any other dessert made with fruits from the not recommended list. Other Soups made from vegetables that are not recommended or that contain other foods not recommended. The items listed above may not be a complete list of foods and beverages to avoid. Contact your dietitian for more information. This information is not intended to replace advice given to you by your health care provider. Make sure you discuss any questions you have with your health care provider. Document Released: 12/01/2001 Document  Revised: 11/17/2015 Document Reviewed: 05/04/2013 Elsevier Interactive Patient Education  2017 [employer]

## 2016-12-09 LAB — BASIC METABOLIC PANEL
ANION GAP: 8 (ref 5–15)
BUN: 10 mg/dL (ref 6–20)
CALCIUM: 8.8 mg/dL — AB (ref 8.9–10.3)
CO2: 25 mmol/L (ref 22–32)
CREATININE: 1.04 mg/dL (ref 0.61–1.24)
Chloride: 103 mmol/L (ref 101–111)
GFR calc Af Amer: >60 mL/min (ref >60)
GLUCOSE: 95 mg/dL (ref 65–99)
Potassium: 3.8 mmol/L (ref 3.5–5.1)
Sodium: 136 mmol/L (ref 135–145)

## 2016-12-09 LAB — CBC
HCT: 38.7 % — ABNORMAL LOW (ref 39.0–52.0)
HEMOGLOBIN: 12.6 g/dL — AB (ref 13.0–17.0)
MCH: 27.5 pg (ref 26.0–34.0)
MCHC: 32.6 g/dL (ref 30.0–36.0)
MCV: 84.3 fL (ref 78.0–100.0)
Platelets: 236 10*3/uL (ref 150–400)
RBC: 4.59 MIL/uL (ref 4.22–5.81)
RDW: 12.5 % (ref 11.5–15.5)
WBC: 6.8 10*3/uL (ref 4.0–10.5)

## 2016-12-09 MED ORDER — AMOXICILLIN-POT CLAVULANATE 875-125 MG PO TABS: 1.0000 | ORAL_TABLET | Freq: Two times a day (BID) | ORAL | 0 refills | Status: AC

## 2016-12-09 NOTE — Progress Notes (Signed)
Pt is ambulating independently, tolerating soft diet, denies abd pain. Discharge instructions given to pt, verbalized understanding. Discharged to home.

## 2016-12-09 NOTE — Care Management Note (Signed)
Case Management Note  Patient Details  Name: Matthew English MRN: [id number] Date of Birth: 16-Feb-1986  Subjective/Objective:                   Sigmoid abscess Action/Plan: Discharge planning Expected Discharge Date:  12/09/16               Expected Discharge Plan:  Home/Self Care  In-House Referral:     Discharge [facility], Medication Assistance, [facility]  Post Acute Care Choice:    Choice offered to:  Patient  DME Arranged:  N/A DME Agency:  NA  HH Arranged:  NA HH Agency:  NA  Status of Service:  Completed, signed off  If discussed at [place] of Stay Meetings, dates discussed:    Additional Comments: CM met with pt in room and gave pt [place] letter with list of participating pharmacies.  Pt verbalized understanding of all MATCH parameters.  Cm also gave pt [facility] pamphlet to make an appointment for follow up, PCP, med asst.  No other CM needs were communicated. Dellie Catholic, RN 12/09/2016, 11:40 AM
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
## 2019-01-22 ENCOUNTER — Inpatient Hospital Stay (HOSPITAL_COMMUNITY)
Admission: EM | Admit: 2019-01-22 | Discharge: 2019-01-27 | DRG: 391 | Disposition: A | Discharge status: Home / Self Care | Payer: Self-pay | Attending: General Surgery | Admitting: General Surgery

## 2019-01-22 ENCOUNTER — Emergency Department (HOSPITAL_COMMUNITY): Payer: Self-pay

## 2019-01-22 ENCOUNTER — Encounter (HOSPITAL_COMMUNITY): Payer: Self-pay | Admitting: *Deleted

## 2019-01-22 ENCOUNTER — Other Ambulatory Visit: Payer: Self-pay

## 2019-01-22 DIAGNOSIS — Z6834 Body mass index (BMI) 34.0-34.9, adult: Secondary | ICD-10-CM

## 2019-01-22 DIAGNOSIS — Z20828 Contact with and (suspected) exposure to other viral communicable diseases: Secondary | ICD-10-CM | POA: Diagnosis present

## 2019-01-22 DIAGNOSIS — K5792 Diverticulitis of intestine, part unspecified, without perforation or abscess without bleeding: Secondary | ICD-10-CM

## 2019-01-22 DIAGNOSIS — J189 Pneumonia, unspecified organism: Secondary | ICD-10-CM | POA: Diagnosis present

## 2019-01-22 DIAGNOSIS — R911 Solitary pulmonary nodule: Secondary | ICD-10-CM | POA: Diagnosis present

## 2019-01-22 DIAGNOSIS — K5720 Diverticulitis of large intestine with perforation and abscess without bleeding: Principal | ICD-10-CM | POA: Diagnosis present

## 2019-01-22 DIAGNOSIS — E669 Obesity, unspecified: Secondary | ICD-10-CM | POA: Diagnosis present

## 2019-01-22 DIAGNOSIS — K5900 Constipation, unspecified: Secondary | ICD-10-CM | POA: Diagnosis present

## 2019-01-22 LAB — CBC
HCT: 42.6 % (ref 39.0–52.0)
Hemoglobin: 14 g/dL (ref 13.0–17.0)
MCH: 28.9 pg (ref 26.0–34.0)
MCHC: 32.9 g/dL (ref 30.0–36.0)
MCV: 88 fL (ref 80.0–100.0)
Platelets: 226 10*3/uL (ref 150–400)
RBC: 4.84 MIL/uL (ref 4.22–5.81)
RDW: 12.1 % (ref 11.5–15.5)
WBC: 7.8 10*3/uL (ref 4.0–10.5)
nRBC: 0 % (ref 0.0–0.2)

## 2019-01-22 LAB — URINALYSIS, ROUTINE W REFLEX MICROSCOPIC
Bilirubin Urine: NEGATIVE
Glucose, UA: NEGATIVE mg/dL
Hgb urine dipstick: NEGATIVE
Ketones, ur: NEGATIVE mg/dL
Leukocytes,Ua: NEGATIVE
Nitrite: NEGATIVE
Protein, ur: NEGATIVE mg/dL
Specific Gravity, Urine: 1.015 (ref 1.005–1.030)
pH: 7 (ref 5.0–8.0)

## 2019-01-22 LAB — COMPREHENSIVE METABOLIC PANEL
ALT: 26 U/L (ref 0–44)
AST: 16 U/L (ref 15–41)
Albumin: 4.1 g/dL (ref 3.5–5.0)
Alkaline Phosphatase: 60 U/L (ref 38–126)
Anion gap: 11 (ref 5–15)
BUN: 13 mg/dL (ref 6–20)
CO2: 22 mmol/L (ref 22–32)
Calcium: 9.3 mg/dL (ref 8.9–10.3)
Chloride: 106 mmol/L (ref 98–111)
Creatinine, Ser: 1.06 mg/dL (ref 0.61–1.24)
GFR calc Af Amer: >60 mL/min (ref >60)
GFR calc non Af Amer: >60 mL/min (ref >60)
Glucose, Bld: 96 mg/dL (ref 70–99)
Potassium: 3.8 mmol/L (ref 3.5–5.1)
Sodium: 139 mmol/L (ref 135–145)
Total Bilirubin: 0.5 mg/dL (ref 0.3–1.2)
Total Protein: 6.4 g/dL — ABNORMAL LOW (ref 6.5–8.1)

## 2019-01-22 LAB — LIPASE, BLOOD: Lipase: 27 U/L (ref 11–51)

## 2019-01-22 MED ORDER — ONDANSETRON 4 MG PO TBDP: 4.0000 mg | ORAL_TABLET | Freq: Four times a day (QID) | ORAL | Status: DC | PRN

## 2019-01-22 MED ORDER — ACETAMINOPHEN 650 MG RE SUPP: 650.0000 mg | Freq: Four times a day (QID) | RECTAL | Status: DC | PRN

## 2019-01-22 MED ORDER — SODIUM CHLORIDE 0.9% FLUSH: 3.0000 mL | Freq: Once | INTRAVENOUS | Status: DC

## 2019-01-22 MED ORDER — METHOCARBAMOL 500 MG PO TABS
500.0000 mg | ORAL_TABLET | Freq: Four times a day (QID) | ORAL | Status: DC | PRN
  Administered 2019-01-23 – 2019-01-27 (×4): 500 mg via ORAL
  Filled 2019-01-22 (×4): qty 1

## 2019-01-22 MED ORDER — DIPHENHYDRAMINE HCL 50 MG/ML IJ SOLN: 25.0000 mg | Freq: Four times a day (QID) | INTRAMUSCULAR | Status: DC | PRN

## 2019-01-22 MED ORDER — SIMETHICONE 80 MG PO CHEW
40.0000 mg | CHEWABLE_TABLET | Freq: Four times a day (QID) | ORAL | Status: DC | PRN
  Administered 2019-01-23 – 2019-01-27 (×2): 40 mg via ORAL
  Filled 2019-01-22 (×2): qty 1

## 2019-01-22 MED ORDER — DIPHENHYDRAMINE HCL 25 MG PO CAPS: 25.0000 mg | ORAL_CAPSULE | Freq: Four times a day (QID) | ORAL | Status: DC | PRN

## 2019-01-22 MED ORDER — BISACODYL 10 MG RE SUPP: 10.0000 mg | Freq: Every day | RECTAL | Status: DC | PRN

## 2019-01-22 MED ORDER — DEXTROSE-NACL 5-0.45 % IV SOLN
INTRAVENOUS | Status: DC
  Administered 2019-01-22: 16:00:00 via INTRAVENOUS

## 2019-01-22 MED ORDER — HYDRALAZINE HCL 20 MG/ML IJ SOLN: 10.0000 mg | INTRAMUSCULAR | Status: DC | PRN

## 2019-01-22 MED ORDER — ENOXAPARIN SODIUM 40 MG/0.4ML ~~LOC~~ SOLN
40.0000 mg | SUBCUTANEOUS | Status: DC
  Administered 2019-01-22 – 2019-01-26 (×5): 40 mg via SUBCUTANEOUS
  Filled 2019-01-22 (×5): qty 0.4

## 2019-01-22 MED ORDER — KETOROLAC TROMETHAMINE 30 MG/ML IJ SOLN
30.0000 mg | Freq: Once | INTRAMUSCULAR | Status: AC
  Administered 2019-01-22: 30 mg via INTRAVENOUS
  Filled 2019-01-22: qty 1

## 2019-01-22 MED ORDER — SODIUM CHLORIDE 0.9 % IV BOLUS
1000.0000 mL | Freq: Once | INTRAVENOUS | Status: AC
  Administered 2019-01-22: 1000 mL via INTRAVENOUS

## 2019-01-22 MED ORDER — DOCUSATE SODIUM 100 MG PO CAPS
100.0000 mg | ORAL_CAPSULE | Freq: Two times a day (BID) | ORAL | Status: DC
  Administered 2019-01-22 – 2019-01-24 (×4): 100 mg via ORAL
  Filled 2019-01-22 (×4): qty 1

## 2019-01-22 MED ORDER — POLYETHYLENE GLYCOL 3350 17 G PO PACK: 17.0000 g | PACK | Freq: Every day | ORAL | Status: DC | PRN

## 2019-01-22 MED ORDER — FENTANYL CITRATE (PF) 100 MCG/2ML IJ SOLN
50.0000 ug | INTRAMUSCULAR | Status: DC | PRN
  Administered 2019-01-22: 50 ug via NASAL
  Filled 2019-01-22: qty 2

## 2019-01-22 MED ORDER — ONDANSETRON HCL 4 MG/2ML IJ SOLN: 4.0000 mg | Freq: Four times a day (QID) | INTRAMUSCULAR | Status: DC | PRN

## 2019-01-22 MED ORDER — PIPERACILLIN-TAZOBACTAM 3.375 G IVPB
3.3750 g | Freq: Three times a day (TID) | INTRAVENOUS | Status: DC
  Administered 2019-01-23 – 2019-01-27 (×14): 3.375 g via INTRAVENOUS
  Filled 2019-01-22 (×14): qty 50

## 2019-01-22 MED ORDER — PANTOPRAZOLE SODIUM 40 MG IV SOLR
40.0000 mg | Freq: Every day | INTRAVENOUS | Status: DC
  Administered 2019-01-22 – 2019-01-26 (×5): 40 mg via INTRAVENOUS
  Filled 2019-01-22 (×5): qty 40

## 2019-01-22 MED ORDER — MORPHINE SULFATE (PF) 2 MG/ML IV SOLN: 1.0000 mg | INTRAVENOUS | Status: DC | PRN

## 2019-01-22 MED ORDER — PIPERACILLIN-TAZOBACTAM 3.375 G IVPB
3.3750 g | Freq: Once | INTRAVENOUS | Status: AC
  Administered 2019-01-22: 3.375 g via INTRAVENOUS
  Filled 2019-01-22: qty 50

## 2019-01-22 MED ORDER — ACETAMINOPHEN 325 MG PO TABS
650.0000 mg | ORAL_TABLET | Freq: Four times a day (QID) | ORAL | Status: DC | PRN
  Administered 2019-01-22 – 2019-01-23 (×4): 650 mg via ORAL
  Filled 2019-01-22 (×4): qty 2

## 2019-01-22 MED ORDER — OXYCODONE HCL 5 MG PO TABS
5.0000 mg | ORAL_TABLET | ORAL | Status: DC | PRN
  Administered 2019-01-23: 5 mg via ORAL
  Administered 2019-01-23 – 2019-01-24 (×2): 10 mg via ORAL
  Filled 2019-01-22: qty 1
  Filled 2019-01-22 (×2): qty 2

## 2019-01-22 MED ORDER — IOHEXOL 300 MG/ML  SOLN
100.0000 mL | Freq: Once | INTRAMUSCULAR | Status: AC | PRN
  Administered 2019-01-22: 100 mL via INTRAVENOUS

## 2019-01-22 NOTE — ED Notes (Signed)
Patient transported to CT 

## 2019-01-22 NOTE — H&P (Signed)
[facility]  Matthew English 05/22/1986  [id number].    Requesting MD: Dr. Judd Lienelo Chief Complaint/Reason for Consult: Diverticulitis with Microperforation  HPI: Matthew CradleGeorge Crunk is 33 y.o. male with a history of diverticulitis who presented to the ED for abdominal pain.  Patient reports that he has been having intermittent left-sided abdominal pain over the last 1 month but attributed this secondary to abdominal exercises that he is performing as he is trying to enter the [employer].  He notes that yesterday morning he started to feel bloated and "gassy".  When he lay down at night he started having crampy abdominal pain of his left abdomen with associated subjective fever and chills.  His symptoms continued into the morning, with his pain waxing and waning.  He notes that leaning forward makes his symptoms worse.  He has not tried any medications to relieve he ate his pain.  Patient does have a history of diverticulitis in the past.  He was admitted he reports this is similar to the presentation he has had when he had diverticulitis back in 2018 with a 22 mm abscess that was determined too small for IR drainage.  This resolved without surgery.  Patient reports that he did not follow-up with [facility] or GI afterwards.  He has never had a colonoscopy.  He is unsure about family history of colon cancer.  Patient does not take any daily medications.  He denies any medical history.  He is not on blood thinners.  Last BM was yesterday, small and soft.  He notes he normally has 2-3 soft bowel movements per day.  He denies history of constipation.  ROS: Review of Systems  Constitutional: Positive for chills and fever.  Respiratory: Negative for cough and shortness of breath.   Cardiovascular: Negative for chest pain.  Gastrointestinal: Positive for abdominal pain. Negative for constipation, diarrhea, nausea and vomiting.  Genitourinary: Negative for dysuria.  All other systems reviewed and are  negative. All systems reviewed and otherwise negative except for as above*  No family history on file.  Past Medical History:  Diagnosis Date  . Diverticulitis     Past Surgical History:  Procedure Laterality Date  . NO PAST SURGERIES      Social History:  reports that he has never smoked. He has never used smokeless tobacco. He reports current alcohol use. He reports that he does not use drugs.  Allergies: No Known Allergies  (Not in a hospital admission)   Prior to Admission medications   Not on File    Blood pressure 122/76, pulse 84, temperature 100.2 F (37.9 C), temperature source Oral, resp. rate 16, height 6' (1.829 m), SpO2 100 %. Physical Exam: General: pleasant, WD/WN male who is laying in bed in NAD HEENT: head is normocephalic, atraumatic.  Sclera are noninjected.  Pupils equal and round.  Ears and nose without any masses or lesions.  Mouth is pink and moist. Dentition fair Heart: regular, rate, and rhythm.  No obvious murmurs, gallops, or rubs noted.  Palpable pedal pulses bilaterally Lungs: CTAB, no wheezes, rhonchi, or rales noted.  Respiratory effort nonlabored Abd: Soft, ND, tenderness of the LLQ and LUQ with voluntary guarding. No rebound, rigidity or involuntary guarding. No peritonitis. +BS, no masses, hernias, or organomegaly MS: all 4 extremities are symmetrical with no cyanosis, clubbing, or edema. Skin: warm and dry with no masses, lesions, or rashes Psych: A&Ox3 with an appropriate affect. Neuro: cranial nerves grossly intact, extremity CSM intact bilaterally, normal speech  Results for orders placed or performed during the hospital encounter of 01/22/19 (from the past 48 hour(s))  Lipase, blood     Status: None   Collection Time: 01/22/19  7:52 AM  Result Value Ref Range   Lipase 27 11 - 51 U/L    Comment: Performed at [facility], [address], [place], [place] [address]  Comprehensive metabolic panel     Status: Abnormal   Collection  Time: 01/22/19  7:52 AM  Result Value Ref Range   Sodium 139 135 - 145 mmol/L   Potassium 3.8 3.5 - 5.1 mmol/L   Chloride 106 98 - 111 mmol/L   CO2 22 22 - 32 mmol/L   Glucose, Bld 96 70 - 99 mg/dL   BUN 13 6 - 20 mg/dL   Creatinine, Ser 1.06 0.61 - 1.24 mg/dL   Calcium 9.3 8.9 - 10.3 mg/dL   Total Protein 6.4 (L) 6.5 - 8.1 g/dL   Albumin 4.1 3.5 - 5.0 g/dL   AST 16 15 - 41 U/L   ALT 26 0 - 44 U/L   Alkaline Phosphatase 60 38 - 126 U/L   Total Bilirubin 0.5 0.3 - 1.2 mg/dL   GFR calc non Af Amer >60 >60 mL/min   GFR calc Af Amer >60 >60 mL/min   Anion gap 11 5 - 15    Comment: Performed at [facility], [place] [address] [place] [address]  CBC     Status: None   Collection Time: 01/22/19  7:52 AM  Result Value Ref Range   WBC 7.8 4.0 - 10.5 K/uL   RBC 4.84 4.22 - 5.81 MIL/uL   Hemoglobin 14.0 13.0 - 17.0 g/dL   HCT 42.6 39.0 - 52.0 %   MCV 88.0 80.0 - 100.0 fL   MCH 28.9 26.0 - 34.0 pg   MCHC 32.9 30.0 - 36.0 g/dL   RDW 12.1 11.5 - 15.5 %   Platelets 226 150 - 400 K/uL   nRBC 0.0 0.0 - 0.2 %    Comment: Performed at [facility], [place] [address], [place], [place] [address]  Urinalysis, Routine w reflex microscopic     Status: None   Collection Time: 01/22/19  7:52 AM  Result Value Ref Range   Color, Urine YELLOW YELLOW   APPearance CLEAR CLEAR   Specific Gravity, Urine 1.015 1.005 - 1.030   pH 7.0 5.0 - 8.0   Glucose, UA NEGATIVE NEGATIVE mg/dL   Hgb urine dipstick NEGATIVE NEGATIVE   Bilirubin Urine NEGATIVE NEGATIVE   Ketones, ur NEGATIVE NEGATIVE mg/dL   Protein, ur NEGATIVE NEGATIVE mg/dL   Nitrite NEGATIVE NEGATIVE   Leukocytes,Ua NEGATIVE NEGATIVE    Comment: Performed at [facility] [address], [place], [place] [address]   Ct Abdomen Pelvis W Contrast  Addendum Date: 01/22/2019   ADDENDUM REPORT: 01/22/2019 15:01 ADDENDUM: Add to IMPRESSION: 5 mm nodular opacity right lower lobe. No follow-up needed if patient is low-risk.  Non-contrast chest CT can be considered in 12 months if patient is high-risk. This recommendation follows the consensus statement: Guidelines for Management of Incidental Pulmonary Nodules Detected on CT Images: From the Fleischner Society 2017; Radiology 2017; 284:228-243. Electronically Signed   By: Lowella Grip III M.D.   On: 01/22/2019 15:01   Result Date: 01/22/2019 CLINICAL DATA:  Left lower quadrant pain for 2 days EXAM: CT ABDOMEN AND PELVIS WITH CONTRAST TECHNIQUE: Multidetector CT imaging of the abdomen and  pelvis was performed using the standard protocol following bolus administration of intravenous contrast. CONTRAST:  100mL OMNIPAQUE IOHEXOL 300 MG/ML  SOLN COMPARISON:  December 06, 2016 FINDINGS: Lower chest: There is a 5 mm nodular opacity abutting the pleura in the anterior segment right lower lobe seen on axial slice 2 series 5. Lung bases otherwise are clear. Hepatobiliary: No focal liver lesions are evident. Gallbladder wall is not appreciably thickened. There is no biliary duct dilatation. Pancreas: There is no pancreatic mass or inflammatory focus. Spleen: No splenic lesions are evident. Adrenals/Urinary Tract: Adrenals bilaterally appear normal. Kidneys bilaterally show no evident mass or hydronephrosis on either side. There is no renal or ureteral calculus on either side. Urinary bladder is midline with wall thickness within normal limits. Stomach/Bowel: There is wall thickening in the distal descending and proximal sigmoid colon with surrounding mesenteric thickening and fluid. There is a microperforation in this area of diverticulitis in the upper left pelvis, anterior to the left psoas muscle. No abscess is evident. More distally in the sigmoid colon, there are diverticula without diverticulitis. There is no evident bowel obstruction. Terminal ileum appears unremarkable. Beyond the microperforation in the left lower quadrant, there is no free air or portal venous air. Vascular/Lymphatic:  There is no abdominal aortic aneurysm. No vascular lesions are evident. There is no adenopathy in the abdomen or pelvis. Reproductive: Prostate and seminal vesicles appear normal in size and contour. No pelvic mass evident. Other: The appendix appears normal. There is no evident abscess or ascites in the abdomen or pelvis. There is thinning of the rectus muscle at the umbilicus in the midline. Musculoskeletal: No blastic or lytic bone lesions. No intramuscular or abdominal wall lesions evident. IMPRESSION: 1. Distal descending colon and proximal sigmoid diverticulitis with evidence of microperforation. There is soft tissue stranding and fluid in this area of acute diverticulitis without frank abscess. 2. Moderate diverticulosis without frank diverticulitis more distally in the sigmoid colon. 3. No bowel obstruction. No abscess in the abdomen or pelvis. Appendix appears normal. 4. No demonstrable renal or ureteral calculus. No hydronephrosis. Urinary bladder wall thickness is within normal limits. Critical Value/emergent results were called by telephone at the time of interpretation on 01/22/2019 at 2:44 pm to Dr. Geoffery LyonsUGLAS DELO , who verbally acknowledged these results. Electronically Signed: By: Bretta BangWilliam  Woodruff III M.D. On: 01/22/2019 14:44    Anti-infectives (From admission, onward)   Start     Dose/Rate Route Frequency Ordered Stop   01/22/19 2300  piperacillin-tazobactam (ZOSYN) IVPB 3.375 g     3.375 g 12.5 mL/hr over 240 Minutes Intravenous Every 8 hours 01/22/19 1548     01/22/19 1500  piperacillin-tazobactam (ZOSYN) IVPB 3.375 g     3.375 g 12.5 mL/hr over 240 Minutes Intravenous  Once 01/22/19 1457         Assessment/Plan Incidental lung nodule seen on CT  Sigmoid diverticulitis with microperforation - No indication for emergent surgery  - Agree with IV abx, continue - Bowel rest, IVF - Recommend followup with colorectal surgeon and GI to have colonoscopy in 6-8 weeks if resolves with  conservative management.  - We discussed possible hospital course. Hopefully he will improve with IV abx and bowel rest. We discussed if he was to worsen there is a possibility of repeat CT scan, IR drainage if he was to form an abscess or possible surgery  ID - Zosyn VTE - SCDs, Lovenox FEN - NPO, IVF POC - Patient's finance at bedside   Jacinto HalimMichael M ,  [facility] 01/22/2019, 3:50 PM Pager: [phone]

## 2019-01-22 NOTE — ED Notes (Signed)
Report given to Michelle, RN on 5 N

## 2019-01-22 NOTE — ED Triage Notes (Signed)
Pt in c/o LLQ pain onset x 2 days, pt reports LBM yesterday, pt denies n/v/d, pt hx of diverticulosis, pt denies bloody & black stools, A&O x4
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
## 2019-01-22 NOTE — ED Notes (Signed)
ED TO INPATIENT HANDOFF REPORT  ED Nurse Name and Phone #: Lorrin Goodell [phone]  S Name/Age/Gender Matthew English 33 y.o. male Room/Bed: H023C/H023C  Code Status   Code Status: Full Code  Home/SNF/Other Home Patient oriented to: self, place, time and situation Is this baseline? Yes   Triage Complete: Triage complete  Chief Complaint Left flank pain   Triage Note Pt in c/o LLQ pain onset x 2 days, pt reports LBM yesterday, pt denies n/v/d, pt hx of diverticulosis, pt denies bloody & black stools, A&O x4   Allergies No Known Allergies  Level of Care/Admitting Diagnosis ED Disposition    ED Disposition Condition [facility]: [place] [100100]  Level of Care: Med-Surg [16]  Covid Evaluation: Asymptomatic Screening Protocol (No Symptoms)  Diagnosis: Diverticulitis [638756]  Admitting Physician: CCS, [place]  Attending Physician: CCS, MD [[id number]  Bed request comments: 6N  PT Class (Do Not Modify): Observation [104]  PT Acc Code (Do Not Modify): Observation [10022]       B Medical/Surgery History Past Medical History:  Diagnosis Date  . Diverticulitis    Past Surgical History:  Procedure Laterality Date  . NO PAST SURGERIES       A IV Location/Drains/Wounds Patient Lines/Drains/Airways Status   Active Line/Drains/Airways    Name:   Placement date:   Placement time:   Site:   Days:   Peripheral IV 01/22/19 Left Antecubital   01/22/19    1324    Antecubital   less than 1          Intake/Output Last 24 hours  Intake/Output Summary (Last 24 hours) at 01/22/2019 1619 Last data filed at 01/22/2019 1448 Gross per 24 hour  Intake 1000 ml  Output -  Net 1000 ml    Labs/Imaging Results for orders placed or performed during the hospital encounter of 01/22/19 (from the past 48 hour(s))  Lipase, blood     Status: None   Collection Time: 01/22/19  7:52 AM  Result Value Ref Range   Lipase 27 11 - 51 U/L    Comment:  Performed at [facility], [address] [place], [place] [address]  Comprehensive metabolic panel     Status: Abnormal   Collection Time: 01/22/19  7:52 AM  Result Value Ref Range   Sodium 139 135 - 145 mmol/L   Potassium 3.8 3.5 - 5.1 mmol/L   Chloride 106 98 - 111 mmol/L   CO2 22 22 - 32 mmol/L   Glucose, Bld 96 70 - 99 mg/dL   BUN 13 6 - 20 mg/dL   Creatinine, Ser 1.06 0.61 - 1.24 mg/dL   Calcium 9.3 8.9 - 10.3 mg/dL   Total Protein 6.4 (L) 6.5 - 8.1 g/dL   Albumin 4.1 3.5 - 5.0 g/dL   AST 16 15 - 41 U/L   ALT 26 0 - 44 U/L   Alkaline Phosphatase 60 38 - 126 U/L   Total Bilirubin 0.5 0.3 - 1.2 mg/dL   GFR calc non Af Amer >60 >60 mL/min   GFR calc Af Amer >60 >60 mL/min   Anion gap 11 5 - 15    Comment: Performed at [facility], [place] [address] [place] [address]  CBC     Status: None   Collection Time: 01/22/19  7:52 AM  Result Value Ref Range   WBC 7.8 4.0 - 10.5 K/uL   RBC 4.84 4.22 - 5.81 MIL/uL  Hemoglobin 14.0 13.0 - 17.0 g/dL   HCT 16.142.6 09.639.0 - 04.552.0 %   MCV 88.0 80.0 - 100.0 fL   MCH 28.9 26.0 - 34.0 pg   MCHC 32.9 30.0 - 36.0 g/dL   RDW 40.912.1 81.111.5 - 91.415.5 %   Platelets 226 150 - 400 K/uL   nRBC 0.0 0.0 - 0.2 %    Comment: Performed at [facility], [address] [place], [place] [address]  Urinalysis, Routine w reflex microscopic     Status: None   Collection Time: 01/22/19  7:52 AM  Result Value Ref Range   Color, Urine YELLOW YELLOW   APPearance CLEAR CLEAR   Specific Gravity, Urine 1.015 1.005 - 1.030   pH 7.0 5.0 - 8.0   Glucose, UA NEGATIVE NEGATIVE mg/dL   Hgb urine dipstick NEGATIVE NEGATIVE   Bilirubin Urine NEGATIVE NEGATIVE   Ketones, ur NEGATIVE NEGATIVE mg/dL   Protein, ur NEGATIVE NEGATIVE mg/dL   Nitrite NEGATIVE NEGATIVE   Leukocytes,Ua NEGATIVE NEGATIVE    Comment: Performed at [facility], [address] [place], [place] [address]   Ct Abdomen Pelvis W Contrast  Addendum Date: 01/22/2019   ADDENDUM  REPORT: 01/22/2019 15:01 ADDENDUM: Add to IMPRESSION: 5 mm nodular opacity right lower lobe. No follow-up needed if patient is low-risk. Non-contrast chest CT can be considered in 12 months if patient is high-risk. This recommendation follows the consensus statement: Guidelines for Management of Incidental Pulmonary Nodules Detected on CT Images: From the Fleischner Society 2017; Radiology 2017; 284:228-243. Electronically Signed   By: Bretta BangWilliam  Woodruff III M.D.   On: 01/22/2019 15:01   Result Date: 01/22/2019 CLINICAL DATA:  Left lower quadrant pain for 2 days EXAM: CT ABDOMEN AND PELVIS WITH CONTRAST TECHNIQUE: Multidetector CT imaging of the abdomen and pelvis was performed using the standard protocol following bolus administration of intravenous contrast. CONTRAST:  100mL OMNIPAQUE IOHEXOL 300 MG/ML  SOLN COMPARISON:  December 06, 2016 FINDINGS: Lower chest: There is a 5 mm nodular opacity abutting the pleura in the anterior segment right lower lobe seen on axial slice 2 series 5. Lung bases otherwise are clear. Hepatobiliary: No focal liver lesions are evident. Gallbladder wall is not appreciably thickened. There is no biliary duct dilatation. Pancreas: There is no pancreatic mass or inflammatory focus. Spleen: No splenic lesions are evident. Adrenals/Urinary Tract: Adrenals bilaterally appear normal. Kidneys bilaterally show no evident mass or hydronephrosis on either side. There is no renal or ureteral calculus on either side. Urinary bladder is midline with wall thickness within normal limits. Stomach/Bowel: There is wall thickening in the distal descending and proximal sigmoid colon with surrounding mesenteric thickening and fluid. There is a microperforation in this area of diverticulitis in the upper left pelvis, anterior to the left psoas muscle. No abscess is evident. More distally in the sigmoid colon, there are diverticula without diverticulitis. There is no evident bowel obstruction. Terminal ileum  appears unremarkable. Beyond the microperforation in the left lower quadrant, there is no free air or portal venous air. Vascular/Lymphatic: There is no abdominal aortic aneurysm. No vascular lesions are evident. There is no adenopathy in the abdomen or pelvis. Reproductive: Prostate and seminal vesicles appear normal in size and contour. No pelvic mass evident. Other: The appendix appears normal. There is no evident abscess or ascites in the abdomen or pelvis. There is thinning of the rectus muscle at the umbilicus in the midline. Musculoskeletal: No blastic or lytic bone lesions. No intramuscular or abdominal  wall lesions evident. IMPRESSION: 1. Distal descending colon and proximal sigmoid diverticulitis with evidence of microperforation. There is soft tissue stranding and fluid in this area of acute diverticulitis without frank abscess. 2. Moderate diverticulosis without frank diverticulitis more distally in the sigmoid colon. 3. No bowel obstruction. No abscess in the abdomen or pelvis. Appendix appears normal. 4. No demonstrable renal or ureteral calculus. No hydronephrosis. Urinary bladder wall thickness is within normal limits. Critical Value/emergent results were called by telephone at the time of interpretation on 01/22/2019 at 2:44 pm to Dr. Geoffery LyonsUGLAS DELO , who verbally acknowledged these results. Electronically Signed: By: Bretta BangWilliam  Woodruff III M.D. On: 01/22/2019 14:44    Pending Labs Unresulted Labs (From admission, onward)    Start     Ordered   01/23/19 0500  Basic metabolic panel  Tomorrow morning,   R     01/22/19 1548   01/23/19 0500  CBC  Tomorrow morning,   R     01/22/19 1548   01/22/19 1542  HIV antibody (Routine Testing)  Once,   STAT     01/22/19 1548          Vitals/Pain Today's Vitals   01/22/19 1447 01/22/19 1506 01/22/19 1530 01/22/19 1600  BP: 129/74 126/70 122/76 132/75  Pulse: 71 (!) 157 84 69  Resp: 16     Temp:      TempSrc:      SpO2: 94% 99% 100% 99%   Height:      PainSc:        Isolation Precautions No active isolations  Medications Medications  piperacillin-tazobactam (ZOSYN) IVPB 3.375 g (3.375 g Intravenous New Bag/Given 01/22/19 1511)  enoxaparin (LOVENOX) injection 40 mg (has no administration in time range)  dextrose 5 %-0.45 % sodium chloride infusion ( Intravenous New Bag/Given 01/22/19 1605)  piperacillin-tazobactam (ZOSYN) IVPB 3.375 g (has no administration in time range)  acetaminophen (TYLENOL) tablet 650 mg (has no administration in time range)    Or  acetaminophen (TYLENOL) suppository 650 mg (has no administration in time range)  oxyCODONE (Oxy IR/ROXICODONE) immediate release tablet 5-10 mg (has no administration in time range)  morphine 2 MG/ML injection 1-2 mg (has no administration in time range)  methocarbamol (ROBAXIN) tablet 500 mg (has no administration in time range)  diphenhydrAMINE (BENADRYL) capsule 25 mg (has no administration in time range)    Or  diphenhydrAMINE (BENADRYL) injection 25 mg (has no administration in time range)  docusate sodium (COLACE) capsule 100 mg (has no administration in time range)  polyethylene glycol (MIRALAX / GLYCOLAX) packet 17 g (has no administration in time range)  bisacodyl (DULCOLAX) suppository 10 mg (has no administration in time range)  ondansetron (ZOFRAN-ODT) disintegrating tablet 4 mg (has no administration in time range)    Or  ondansetron (ZOFRAN) injection 4 mg (has no administration in time range)  simethicone (MYLICON) chewable tablet 40 mg (has no administration in time range)  pantoprazole (PROTONIX) injection 40 mg (has no administration in time range)  hydrALAZINE (APRESOLINE) injection 10 mg (has no administration in time range)  sodium chloride 0.9 % bolus 1,000 mL (0 mLs Intravenous Stopped 01/22/19 1448)  ketorolac (TORADOL) 30 MG/ML injection 30 mg (30 mg Intravenous Given 01/22/19 1326)  iohexol (OMNIPAQUE) 300 MG/ML solution 100 mL (100 mLs  Intravenous Contrast Given 01/22/19 1358)    Mobility walks Low fall risk   Focused Assessments    R Recommendations: See Admitting Provider Note  Report given to:   Additional Notes:

## 2019-01-23 LAB — BASIC METABOLIC PANEL
Anion gap: 8 (ref 5–15)
BUN: 10 mg/dL (ref 6–20)
CO2: 24 mmol/L (ref 22–32)
Calcium: 8.5 mg/dL — ABNORMAL LOW (ref 8.9–10.3)
Chloride: 105 mmol/L (ref 98–111)
Creatinine, Ser: 1.21 mg/dL (ref 0.61–1.24)
GFR calc Af Amer: >60 mL/min (ref >60)
GFR calc non Af Amer: >60 mL/min (ref >60)
Glucose, Bld: 101 mg/dL — ABNORMAL HIGH (ref 70–99)
Potassium: 3.1 mmol/L — ABNORMAL LOW (ref 3.5–5.1)
Sodium: 137 mmol/L (ref 135–145)

## 2019-01-23 LAB — CBC
HCT: 36.8 % — ABNORMAL LOW (ref 39.0–52.0)
Hemoglobin: 12.1 g/dL — ABNORMAL LOW (ref 13.0–17.0)
MCH: 29 pg (ref 26.0–34.0)
MCHC: 32.9 g/dL (ref 30.0–36.0)
MCV: 88.2 fL (ref 80.0–100.0)
Platelets: 170 10*3/uL (ref 150–400)
RBC: 4.17 MIL/uL — ABNORMAL LOW (ref 4.22–5.81)
RDW: 12.2 % (ref 11.5–15.5)
WBC: 10 10*3/uL (ref 4.0–10.5)
nRBC: 0 % (ref 0.0–0.2)

## 2019-01-23 LAB — HIV ANTIBODY (ROUTINE TESTING W REFLEX): HIV Screen 4th Generation wRfx: NONREACTIVE

## 2019-01-23 MED ORDER — POTASSIUM CHLORIDE 10 MEQ/100ML IV SOLN
10.0000 meq | INTRAVENOUS | Status: AC
  Administered 2019-01-23 (×5): 10 meq via INTRAVENOUS
  Filled 2019-01-23 (×5): qty 100

## 2019-01-23 NOTE — Progress Notes (Signed)
Subjective: CC: Abdominal pain Patient reports his abdominal pain is similar to yesterday, maybe slightly improved. He did get up and do some laps around the floor this morning. No N/V. Passing flatus. No BM since admission.   Objective: Vital signs in last 24 hours: Temp:  [99.2 F (37.3 C)-100.9 F (38.3 C)] 99.9 F (37.7 C) (07/31 0748) Pulse Rate:  [69-157] 101 (07/31 0748) Resp:  [16-20] 18 (07/31 0748) BP: (101-132)/(51-79) 127/70 (07/31 0748) SpO2:  [94 %-100 %] 97 % (07/31 0748) Last BM Date: 01/21/19  Intake/Output from previous day: 07/30 0701 - 07/31 0700 In: 1203.9 [I.V.:191.7; IV Piggyback:1012.2] Out: 2 [Urine:2] Intake/Output this shift: Total I/O In: -  Out: 500 [Urine:500]  PE: Gen:  Alert, NAD, pleasant Pulm:  Normal rate and effort  Abd: Soft, ND, tenderness of the LUQ and LLQ with voluntary guarding. No rebound or rigidity. +BS,  Ext:  No edema of BLE Psych: A&Ox3  Skin: no rashes noted, warm and dry  Lab Results:  Recent Labs    01/22/19 0752 01/23/19 0551  WBC 7.8 10.0  HGB 14.0 12.1*  HCT 42.6 36.8*  PLT 226 170   BMET Recent Labs    01/22/19 0752 01/23/19 0551  NA 139 137  K 3.8 3.1*  CL 106 105  CO2 22 24  GLUCOSE 96 101*  BUN 13 10  CREATININE 1.06 1.21  CALCIUM 9.3 8.5*   PT/INR No results for input(s): LABPROT, INR in the last 72 hours. CMP     Component Value Date/Time   NA 137 01/23/2019 0551   K 3.1 (L) 01/23/2019 0551   CL 105 01/23/2019 0551   CO2 24 01/23/2019 0551   GLUCOSE 101 (H) 01/23/2019 0551   BUN 10 01/23/2019 0551   CREATININE 1.21 01/23/2019 0551   CALCIUM 8.5 (L) 01/23/2019 0551   PROT 6.4 (L) 01/22/2019 0752   ALBUMIN 4.1 01/22/2019 0752   AST 16 01/22/2019 0752   ALT 26 01/22/2019 0752   ALKPHOS 60 01/22/2019 0752   BILITOT 0.5 01/22/2019 0752   GFRNONAA >60 01/23/2019 0551   GFRAA >60 01/23/2019 0551   Lipase     Component Value Date/Time   LIPASE 27 01/22/2019 0752        Studies/Results: Ct Abdomen Pelvis W Contrast  Addendum Date: 01/22/2019   ADDENDUM REPORT: 01/22/2019 15:01 ADDENDUM: Add to IMPRESSION: 5 mm nodular opacity right lower lobe. No follow-up needed if patient is low-risk. Non-contrast chest CT can be considered in 12 months if patient is high-risk. This recommendation follows the consensus statement: Guidelines for Management of Incidental Pulmonary Nodules Detected on CT Images: From the Fleischner Society 2017; Radiology 2017; 284:228-243. Electronically Signed   By: Bretta BangWilliam  Woodruff III M.D.   On: 01/22/2019 15:01   Result Date: 01/22/2019 CLINICAL DATA:  Left lower quadrant pain for 2 days EXAM: CT ABDOMEN AND PELVIS WITH CONTRAST TECHNIQUE: Multidetector CT imaging of the abdomen and pelvis was performed using the standard protocol following bolus administration of intravenous contrast. CONTRAST:  100mL OMNIPAQUE IOHEXOL 300 MG/ML  SOLN COMPARISON:  December 06, 2016 FINDINGS: Lower chest: There is a 5 mm nodular opacity abutting the pleura in the anterior segment right lower lobe seen on axial slice 2 series 5. Lung bases otherwise are clear. Hepatobiliary: No focal liver lesions are evident. Gallbladder wall is not appreciably thickened. There is no biliary duct dilatation. Pancreas: There is no pancreatic mass or inflammatory focus. Spleen: No splenic lesions  are evident. Adrenals/Urinary Tract: Adrenals bilaterally appear normal. Kidneys bilaterally show no evident mass or hydronephrosis on either side. There is no renal or ureteral calculus on either side. Urinary bladder is midline with wall thickness within normal limits. Stomach/Bowel: There is wall thickening in the distal descending and proximal sigmoid colon with surrounding mesenteric thickening and fluid. There is a microperforation in this area of diverticulitis in the upper left pelvis, anterior to the left psoas muscle. No abscess is evident. More distally in the sigmoid colon,  there are diverticula without diverticulitis. There is no evident bowel obstruction. Terminal ileum appears unremarkable. Beyond the microperforation in the left lower quadrant, there is no free air or portal venous air. Vascular/Lymphatic: There is no abdominal aortic aneurysm. No vascular lesions are evident. There is no adenopathy in the abdomen or pelvis. Reproductive: Prostate and seminal vesicles appear normal in size and contour. No pelvic mass evident. Other: The appendix appears normal. There is no evident abscess or ascites in the abdomen or pelvis. There is thinning of the rectus muscle at the umbilicus in the midline. Musculoskeletal: No blastic or lytic bone lesions. No intramuscular or abdominal wall lesions evident. IMPRESSION: 1. Distal descending colon and proximal sigmoid diverticulitis with evidence of microperforation. There is soft tissue stranding and fluid in this area of acute diverticulitis without frank abscess. 2. Moderate diverticulosis without frank diverticulitis more distally in the sigmoid colon. 3. No bowel obstruction. No abscess in the abdomen or pelvis. Appendix appears normal. 4. No demonstrable renal or ureteral calculus. No hydronephrosis. Urinary bladder wall thickness is within normal limits. Critical Value/emergent results were called by telephone at the time of interpretation on 01/22/2019 at 2:44 pm to Dr. Veryl Speak , who verbally acknowledged these results. Electronically Signed: By: Lowella Grip III M.D. On: 01/22/2019 14:44    Anti-infectives: Anti-infectives (From admission, onward)   Start     Dose/Rate Route Frequency Ordered Stop   01/22/19 2300  piperacillin-tazobactam (ZOSYN) IVPB 3.375 g     3.375 g 12.5 mL/hr over 240 Minutes Intravenous Every 8 hours 01/22/19 1548     01/22/19 1500  piperacillin-tazobactam (ZOSYN) IVPB 3.375 g     3.375 g 12.5 mL/hr over 240 Minutes Intravenous  Once 01/22/19 1457 01/22/19 1911        Assessment/Plan Incidental lung nodule seen on CT  Sigmoid diverticulitis with microperforation - No indication for emergent surgery  - IV abx, continue - Still tender this AM. Fever overnight. WBC 7.8 -> 10. Leave on bowel rest, IVF - Recommend followup with colorectal surgeon and GI to have colonoscopy in 6-8 weeks if resolves with conservative management.  - We discussed possible hospital course. Hopefully he will improve with IV abx and bowel rest. We discussed if he was to worsen there is a possibility of repeat CT scan, IR drainage if he was to form an abscess or possible surgery  ID - Zosyn VTE - SCDs, Lovenox FEN - NPO, IVF POC - Deborah Natt    LOS: 0 days    Jillyn Ledger , [facility] 01/23/2019, 11:25 AM Pager: [phone]

## 2019-01-24 LAB — CBC
HCT: 36.6 % — ABNORMAL LOW (ref 39.0–52.0)
Hemoglobin: 11.7 g/dL — ABNORMAL LOW (ref 13.0–17.0)
MCH: 28.7 pg (ref 26.0–34.0)
MCHC: 32 g/dL (ref 30.0–36.0)
MCV: 89.9 fL (ref 80.0–100.0)
Platelets: 178 10*3/uL (ref 150–400)
RBC: 4.07 MIL/uL — ABNORMAL LOW (ref 4.22–5.81)
RDW: 12.2 % (ref 11.5–15.5)
WBC: 6.8 10*3/uL (ref 4.0–10.5)
nRBC: 0 % (ref 0.0–0.2)

## 2019-01-24 LAB — BASIC METABOLIC PANEL
Anion gap: 9 (ref 5–15)
BUN: 8 mg/dL (ref 6–20)
CO2: 24 mmol/L (ref 22–32)
Calcium: 8.8 mg/dL — ABNORMAL LOW (ref 8.9–10.3)
Chloride: 104 mmol/L (ref 98–111)
Creatinine, Ser: 1.31 mg/dL — ABNORMAL HIGH (ref 0.61–1.24)
GFR calc Af Amer: >60 mL/min (ref >60)
GFR calc non Af Amer: >60 mL/min (ref >60)
Glucose, Bld: 76 mg/dL (ref 70–99)
Potassium: 4 mmol/L (ref 3.5–5.1)
Sodium: 137 mmol/L (ref 135–145)

## 2019-01-24 MED ORDER — OXYCODONE HCL 5 MG PO TABS
5.0000 mg | ORAL_TABLET | ORAL | Status: DC | PRN
  Administered 2019-01-24 – 2019-01-25 (×3): 5 mg via ORAL
  Filled 2019-01-24 (×3): qty 1

## 2019-01-24 MED ORDER — POLYETHYLENE GLYCOL 3350 17 G PO PACK
17.0000 g | PACK | Freq: Two times a day (BID) | ORAL | Status: DC
  Administered 2019-01-24 – 2019-01-27 (×7): 17 g via ORAL
  Filled 2019-01-24 (×7): qty 1

## 2019-01-24 MED ORDER — SODIUM CHLORIDE 0.9 % IV SOLN
INTRAVENOUS | Status: DC
  Administered 2019-01-24 – 2019-01-25 (×2): via INTRAVENOUS

## 2019-01-24 MED ORDER — BOOST / RESOURCE BREEZE PO LIQD CUSTOM
1.0000 | Freq: Three times a day (TID) | ORAL | Status: DC
  Administered 2019-01-24 – 2019-01-27 (×10): 1 via ORAL

## 2019-01-24 NOTE — Progress Notes (Signed)
Subjective: CC: Abdominal pain Pain improving. No nausea.    Objective: Vital signs in last 24 hours: Temp:  [98.4 F (36.9 C)-100.6 F (38.1 C)] 98.8 F (37.1 C) (08/01 0750) Pulse Rate:  [70-101] 86 (08/01 0750) Resp:  [14-18] 18 (08/01 0750) BP: (105-134)/(50-87) 130/81 (08/01 0750) SpO2:  [97 %-100 %] 100 % (08/01 0750) Last BM Date: 01/21/19  Intake/Output from previous day: 07/31 0701 - 08/01 0700 In: -  Out: 1000 [Urine:1000] Intake/Output this shift: No intake/output data recorded.  PE: Gen:  Alert, NAD, pleasant Pulm:  Normal rate and effort  Abd: Soft, ND, no significant tenderness. No rebound or rigidity.  Ext:  No edema of BLE Psych: A&Ox3  Skin: no rashes noted, warm and dry  Lab Results:  Recent Labs    01/23/19 0551 01/24/19 0400  WBC 10.0 6.8  HGB 12.1* 11.7*  HCT 36.8* 36.6*  PLT 170 178   BMET Recent Labs    01/23/19 0551 01/24/19 0400  NA 137 137  K 3.1* 4.0  CL 105 104  CO2 24 24  GLUCOSE 101* 76  BUN 10 8  CREATININE 1.21 1.31*  CALCIUM 8.5* 8.8*   PT/INR No results for input(s): LABPROT, INR in the last 72 hours. CMP     Component Value Date/Time   NA 137 01/24/2019 0400   K 4.0 01/24/2019 0400   CL 104 01/24/2019 0400   CO2 24 01/24/2019 0400   GLUCOSE 76 01/24/2019 0400   BUN 8 01/24/2019 0400   CREATININE 1.31 (H) 01/24/2019 0400   CALCIUM 8.8 (L) 01/24/2019 0400   PROT 6.4 (L) 01/22/2019 0752   ALBUMIN 4.1 01/22/2019 0752   AST 16 01/22/2019 0752   ALT 26 01/22/2019 0752   ALKPHOS 60 01/22/2019 0752   BILITOT 0.5 01/22/2019 0752   GFRNONAA >60 01/24/2019 0400   GFRAA >60 01/24/2019 0400   Lipase     Component Value Date/Time   LIPASE 27 01/22/2019 0752       Studies/Results: Ct Abdomen Pelvis W Contrast  Addendum Date: 01/22/2019   ADDENDUM REPORT: 01/22/2019 15:01 ADDENDUM: Add to IMPRESSION: 5 mm nodular opacity right lower lobe. No follow-up needed if patient is low-risk. Non-contrast chest  CT can be considered in 12 months if patient is high-risk. This recommendation follows the consensus statement: Guidelines for Management of Incidental Pulmonary Nodules Detected on CT Images: From the Fleischner Society 2017; Radiology 2017; 284:228-243. Electronically Signed   By: Bretta BangWilliam  Woodruff III M.D.   On: 01/22/2019 15:01   Result Date: 01/22/2019 CLINICAL DATA:  Left lower quadrant pain for 2 days EXAM: CT ABDOMEN AND PELVIS WITH CONTRAST TECHNIQUE: Multidetector CT imaging of the abdomen and pelvis was performed using the standard protocol following bolus administration of intravenous contrast. CONTRAST:  100mL OMNIPAQUE IOHEXOL 300 MG/ML  SOLN COMPARISON:  December 06, 2016 FINDINGS: Lower chest: There is a 5 mm nodular opacity abutting the pleura in the anterior segment right lower lobe seen on axial slice 2 series 5. Lung bases otherwise are clear. Hepatobiliary: No focal liver lesions are evident. Gallbladder wall is not appreciably thickened. There is no biliary duct dilatation. Pancreas: There is no pancreatic mass or inflammatory focus. Spleen: No splenic lesions are evident. Adrenals/Urinary Tract: Adrenals bilaterally appear normal. Kidneys bilaterally show no evident mass or hydronephrosis on either side. There is no renal or ureteral calculus on either side. Urinary bladder is midline with wall thickness within normal limits. Stomach/Bowel: There is  wall thickening in the distal descending and proximal sigmoid colon with surrounding mesenteric thickening and fluid. There is a microperforation in this area of diverticulitis in the upper left pelvis, anterior to the left psoas muscle. No abscess is evident. More distally in the sigmoid colon, there are diverticula without diverticulitis. There is no evident bowel obstruction. Terminal ileum appears unremarkable. Beyond the microperforation in the left lower quadrant, there is no free air or portal venous air. Vascular/Lymphatic: There is no  abdominal aortic aneurysm. No vascular lesions are evident. There is no adenopathy in the abdomen or pelvis. Reproductive: Prostate and seminal vesicles appear normal in size and contour. No pelvic mass evident. Other: The appendix appears normal. There is no evident abscess or ascites in the abdomen or pelvis. There is thinning of the rectus muscle at the umbilicus in the midline. Musculoskeletal: No blastic or lytic bone lesions. No intramuscular or abdominal wall lesions evident. IMPRESSION: 1. Distal descending colon and proximal sigmoid diverticulitis with evidence of microperforation. There is soft tissue stranding and fluid in this area of acute diverticulitis without frank abscess. 2. Moderate diverticulosis without frank diverticulitis more distally in the sigmoid colon. 3. No bowel obstruction. No abscess in the abdomen or pelvis. Appendix appears normal. 4. No demonstrable renal or ureteral calculus. No hydronephrosis. Urinary bladder wall thickness is within normal limits. Critical Value/emergent results were called by telephone at the time of interpretation on 01/22/2019 at 2:44 pm to Dr. Veryl Speak , who verbally acknowledged these results. Electronically Signed: By: Lowella Grip III M.D. On: 01/22/2019 14:44    Anti-infectives: Anti-infectives (From admission, onward)   Start     Dose/Rate Route Frequency Ordered Stop   01/22/19 2300  piperacillin-tazobactam (ZOSYN) IVPB 3.375 g     3.375 g 12.5 mL/hr over 240 Minutes Intravenous Every 8 hours 01/22/19 1548     01/22/19 1500  piperacillin-tazobactam (ZOSYN) IVPB 3.375 g     3.375 g 12.5 mL/hr over 240 Minutes Intravenous  Once 01/22/19 1457 01/22/19 1911       Assessment/Plan Incidental lung nodule seen on CT  Sigmoid diverticulitis with microperforation - No indication for emergent surgery  - IV abx, continue - Recommend followup with colorectal surgeon and GI to have colonoscopy in 6-8 weeks if resolves with conservative  management.  - We discussed possible hospital course. Hopefully he will improve with IV abx and bowel rest. We discussed if he was to worsen there is a possibility of repeat CT scan, IR drainage if he was to form an abscess or possible surgery  ID - Zosyn VTE - SCDs, Lovenox FEN - advance to clears. Continue IVF given slight increase in creatinine.  POC - Unice Bailey    LOS: 1 day    [facility] 01/24/2019, 10:16 AM  Please see amion to contact on-call provider if needed

## 2019-01-24 NOTE — Progress Notes (Signed)
The patient has tolerated clear liquids well today

## 2019-01-25 NOTE — Progress Notes (Signed)
Subjective: CC: Abdominal pain Pain continues to improve. Tolerating clears without n/v/distention. +BM this am. Walking.   Objective: Vital signs in last 24 hours: Temp:  [97.8 F (36.6 C)-98.5 F (36.9 C)] 98.5 F (36.9 C) (08/02 0447) Pulse Rate:  [52-76] 52 (08/02 0447) Resp:  [16-20] 16 (08/02 0447) BP: (96-127)/(58-84) 96/58 (08/02 0447) SpO2:  [98 %-99 %] 98 % (08/02 0447) Last BM Date: 01/21/19  Intake/Output from previous day: 08/01 0701 - 08/02 0700 In: 319.9 [I.V.:319.9] Out: -  Intake/Output this shift: No intake/output data recorded.  PE: Gen:  Alert, NAD, pleasant Pulm:  Normal rate and effort  Abd: Soft, ND, no significant tenderness. No rebound or rigidity.  Ext:  No edema of BLE Psych: A&Ox3  Skin: no rashes noted, warm and dry  Lab Results:  Recent Labs    01/23/19 0551 01/24/19 0400  WBC 10.0 6.8  HGB 12.1* 11.7*  HCT 36.8* 36.6*  PLT 170 178   BMET Recent Labs    01/23/19 0551 01/24/19 0400  NA 137 137  K 3.1* 4.0  CL 105 104  CO2 24 24  GLUCOSE 101* 76  BUN 10 8  CREATININE 1.21 1.31*  CALCIUM 8.5* 8.8*   PT/INR No results for input(s): LABPROT, INR in the last 72 hours. CMP     Component Value Date/Time   NA 137 01/24/2019 0400   K 4.0 01/24/2019 0400   CL 104 01/24/2019 0400   CO2 24 01/24/2019 0400   GLUCOSE 76 01/24/2019 0400   BUN 8 01/24/2019 0400   CREATININE 1.31 (H) 01/24/2019 0400   CALCIUM 8.8 (L) 01/24/2019 0400   PROT 6.4 (L) 01/22/2019 0752   ALBUMIN 4.1 01/22/2019 0752   AST 16 01/22/2019 0752   ALT 26 01/22/2019 0752   ALKPHOS 60 01/22/2019 0752   BILITOT 0.5 01/22/2019 0752   GFRNONAA >60 01/24/2019 0400   GFRAA >60 01/24/2019 0400   Lipase     Component Value Date/Time   LIPASE 27 01/22/2019 0752       Studies/Results: No results found.  Anti-infectives: Anti-infectives (From admission, onward)   Start     Dose/Rate Route Frequency Ordered Stop   01/22/19 2300   piperacillin-tazobactam (ZOSYN) IVPB 3.375 g     3.375 g 12.5 mL/hr over 240 Minutes Intravenous Every 8 hours 01/22/19 1548     01/22/19 1500  piperacillin-tazobactam (ZOSYN) IVPB 3.375 g     3.375 g 12.5 mL/hr over 240 Minutes Intravenous  Once 01/22/19 1457 01/22/19 1911       Assessment/Plan Incidental lung nodule seen on CT  Sigmoid diverticulitis with microperforation - No indication for emergent surgery  - IV abx, continue - Recommend followup with colorectal surgeon and GI to have colonoscopy in 6-8 weeks if resolves with conservative management.  - We discussed possible hospital course. Hopefully he will improve with IV abx and bowel rest. We discussed if he was to worsen there is a possibility of repeat CT scan, IR drainage if he was to form an abscess or possible surgery  ID - Zosyn. Afebrile Recheck labs tomorrow. Plan PO abx tomorrow if tolerating sift diet and still no wbc/fever/pain. VTE - SCDs, Lovenox FEN - advance to soft diet. Continue decrease IVF given slight increase in creatinine.  Recheck labs tomorrow. POC - Jacqlyn Larseneborah Natt    LOS: 2 days    Berna Buehelsea A Elienai Gailey MD [facility] 01/25/2019, 9:28 AM  Please see amion to contact on-call  provider if needed

## 2019-01-25 NOTE — Progress Notes (Signed)
The patient has tolerated a soft regular diet today.

## 2019-01-25 NOTE — Progress Notes (Signed)
Pt tolerated clear liquids well this shift 7p-7A. No c/o nausea. No vomiting noted.

## 2019-01-26 ENCOUNTER — Encounter (HOSPITAL_COMMUNITY): Payer: Self-pay | Admitting: Radiology

## 2019-01-26 ENCOUNTER — Inpatient Hospital Stay (HOSPITAL_COMMUNITY): Payer: Self-pay

## 2019-01-26 DIAGNOSIS — K5792 Diverticulitis of intestine, part unspecified, without perforation or abscess without bleeding: Secondary | ICD-10-CM

## 2019-01-26 DIAGNOSIS — R079 Chest pain, unspecified: Secondary | ICD-10-CM

## 2019-01-26 DIAGNOSIS — J181 Lobar pneumonia, unspecified organism: Secondary | ICD-10-CM

## 2019-01-26 LAB — TROPONIN I (HIGH SENSITIVITY)
Troponin I (High Sensitivity): <2 ng/L
Troponin I (High Sensitivity): 3 ng/L

## 2019-01-26 LAB — BASIC METABOLIC PANEL WITH GFR
Anion gap: 8 (ref 5–15)
BUN: 6 mg/dL (ref 6–20)
CO2: 26 mmol/L (ref 22–32)
Calcium: 8.7 mg/dL — ABNORMAL LOW (ref 8.9–10.3)
Chloride: 106 mmol/L (ref 98–111)
Creatinine, Ser: 0.99 mg/dL (ref 0.61–1.24)
GFR calc Af Amer: >60 mL/min
GFR calc non Af Amer: >60 mL/min
Glucose, Bld: 96 mg/dL (ref 70–99)
Potassium: 3.9 mmol/L (ref 3.5–5.1)
Sodium: 140 mmol/L (ref 135–145)

## 2019-01-26 LAB — CBC
HCT: 37.2 % — ABNORMAL LOW (ref 39.0–52.0)
Hemoglobin: 11.9 g/dL — ABNORMAL LOW (ref 13.0–17.0)
MCH: 28.2 pg (ref 26.0–34.0)
MCHC: 32 g/dL (ref 30.0–36.0)
MCV: 88.2 fL (ref 80.0–100.0)
Platelets: 230 10*3/uL (ref 150–400)
RBC: 4.22 MIL/uL (ref 4.22–5.81)
RDW: 11.9 % (ref 11.5–15.5)
WBC: 4.1 10*3/uL (ref 4.0–10.5)
nRBC: 0 % (ref 0.0–0.2)

## 2019-01-26 LAB — SARS CORONAVIRUS 2 BY RT PCR (HOSPITAL ORDER, PERFORMED IN ~~LOC~~ HOSPITAL LAB): SARS Coronavirus 2: NEGATIVE

## 2019-01-26 LAB — PROCALCITONIN: Procalcitonin: 0.24 ng/mL

## 2019-01-26 LAB — D-DIMER, QUANTITATIVE: D-Dimer, Quant: 1.46 ug{FEU}/mL — ABNORMAL HIGH (ref 0.00–0.50)

## 2019-01-26 MED ORDER — IOHEXOL 350 MG/ML SOLN
80.0000 mL | Freq: Once | INTRAVENOUS | Status: AC | PRN
  Administered 2019-01-26: 13:00:00 80 mL via INTRAVENOUS

## 2019-01-26 MED ORDER — GUAIFENESIN ER 600 MG PO TB12
600.0000 mg | ORAL_TABLET | Freq: Two times a day (BID) | ORAL | Status: DC
  Administered 2019-01-26 – 2019-01-27 (×3): 600 mg via ORAL
  Filled 2019-01-26 (×3): qty 1

## 2019-01-26 MED ORDER — ALUM & MAG HYDROXIDE-SIMETH 200-200-20 MG/5ML PO SUSP: 30.0000 mL | ORAL | Status: DC | PRN

## 2019-01-26 NOTE — Consult Note (Addendum)
Medical Consultation   Bion Borland  KVQ:[id number]  DOB: [birth year]  DOA: 01/22/2019  PCP: Patient, No Pcp Per   Requesting physician: Phylliss Blakes, MD  Reason for consultation: Chest pain   History of Present Illness: Joshuel Gurkin is an 33 y.o. male with past medical history of diverticulitis; who presented to the emergency department on 7/30 with complaints of intermittent left-sided abdominal pain over the last month.  CT scan revealed distal descending colon and proximal sigmoid colon diverticulitis with evidence of microperforation.  Last noted to have diverticulitis back in 2018 with abscess that was noted to be due to small for drainage at that time.  Patient has been treated with IV Zosyn with improvement of abdominal pain symptoms.  He has currently been in the process of advancing his diet.  However, this morning complained of substernal chest pain that radiates to his right shoulder.  Symptoms worse with taking a deep inspiratory breath.  Pain feels different than heartburn.  Associated symptoms include shortness of breath and aching knees then he relates possibly to the hospital bed.  Denies having any cough, fever, leg swelling, or significant family history of cardiac disease.Marland Kitchen  He had been social distancing while [employer]ing for [employer], and denies any recent sick contacts to his knowledge.  He requests to be tested for COVID 19 because it was not preformed when he was admitted into the hospital.  He would like to make sure that he does not have COVID-19 because his parents are older and staying in the same household.  Review of Systems:  Review of Systems  Constitutional: Negative for fever.  Eyes: Negative for photophobia and pain.  Cardiovascular: Positive for chest pain. Negative for leg swelling.  Gastrointestinal: Negative for heartburn.  Musculoskeletal: Positive for joint pain.   As per HPI otherwise 10 point review of systems negative.     Past  Medical History: Past Medical History:  Diagnosis Date  . Diverticulitis     Past Surgical History: Past Surgical History:  Procedure Laterality Date  . NO PAST SURGERIES       Allergies:  No Known Allergies   Social History:  reports that he has never smoked. He has never used smokeless tobacco. He reports current alcohol use. He reports that he does not use drugs.   Family History: No family history of heart disease    Physical Exam: Vitals:   01/25/19 1550 01/25/19 2059 01/26/19 0440 01/26/19 0933  BP: 132/77 108/83 117/70 126/82  Pulse: 79 83 61 79  Resp:  16 16 20   Temp: 98.3 F (36.8 C) 99.6 F (37.6 C) 97.7 F (36.5 C) 98.7 F (37.1 C)  TempSrc: Oral Oral Oral Oral  SpO2: 99% 100% 100% 100%  Height:        Constitutional: Middle-aged male alert and awake, oriented x3, not in any acute distress. Eyes: PERLA, EOMI, irises appear normal, anicteric sclera,  ENMT: External ears and nose appear normal,            Lips appears normal, oropharynx mucosa, tongue, posterior pharynx appear normal  Neck: neck appears normal, no masses, normal ROM, no thyromegaly, no JVD  CVS: S1-S2 clear, no murmur rubs or gallops, no LE edema, normal pedal pulses  Respiratory:  clear to auscultation bilaterally, no wheezing, rales or rhonchi. Respiratory effort normal. No accessory muscle use.  Abdomen: soft nontender, nondistended, normal bowel sounds, no hepatosplenomegaly, no  hernias  Musculoskeletal: : no cyanosis, clubbing or edema noted bilaterally                      Neuro: Cranial nerves II-XII intact, strength, sensation, reflexes Psych: judgement and insight appear normal, stable mood and affect, mental status Skin: no rashes or lesions or ulcers, no induration or nodules   Data reviewed:  I have personally reviewed following labs and imaging studies Labs:  CBC: Recent Labs  Lab 01/22/19 0752 01/23/19 0551 01/24/19 0400 01/26/19 0449  WBC 7.8 10.0 6.8 4.1  HGB  14.0 12.1* 11.7* 11.9*  HCT 42.6 36.8* 36.6* 37.2*  MCV 88.0 88.2 89.9 88.2  PLT 226 170 178 230    Basic Metabolic Panel: Recent Labs  Lab 01/22/19 0752 01/23/19 0551 01/24/19 0400 01/26/19 0449  NA 139 137 137 140  K 3.8 3.1* 4.0 3.9  CL 106 105 104 106  CO2 22 24 24 26   GLUCOSE 96 101* 76 96  BUN 13 10 8 6   CREATININE 1.06 1.21 1.31* 0.99  CALCIUM 9.3 8.5* 8.8* 8.7*   GFR CrCl cannot be calculated (Unknown ideal weight.). Liver Function Tests: Recent Labs  Lab 01/22/19 0752  AST 16  ALT 26  ALKPHOS 60  BILITOT 0.5  PROT 6.4*  ALBUMIN 4.1   Recent Labs  Lab 01/22/19 0752  LIPASE 27   No results for input(s): AMMONIA in the last 168 hours. Coagulation profile No results for input(s): INR, PROTIME in the last 168 hours.  Cardiac Enzymes: No results for input(s): CKTOTAL, CKMB, CKMBINDEX, TROPONINI in the last 168 hours. BNP: Invalid input(s): POCBNP CBG: No results for input(s): GLUCAP in the last 168 hours. D-Dimer No results for input(s): DDIMER in the last 72 hours. Hgb A1c No results for input(s): HGBA1C in the last 72 hours. Lipid Profile No results for input(s): CHOL, HDL, LDLCALC, TRIG, CHOLHDL, LDLDIRECT in the last 72 hours. Thyroid function studies No results for input(s): TSH, T4TOTAL, T3FREE, THYROIDAB in the last 72 hours.  Invalid input(s): FREET3 Anemia work up No results for input(s): VITAMINB12, FOLATE, FERRITIN, TIBC, IRON, RETICCTPCT in the last 72 hours. Urinalysis    Component Value Date/Time   COLORURINE YELLOW 01/22/2019 0752   APPEARANCEUR CLEAR 01/22/2019 0752   LABSPEC 1.015 01/22/2019 0752   PHURINE 7.0 01/22/2019 0752   GLUCOSEU NEGATIVE 01/22/2019 0752   HGBUR NEGATIVE 01/22/2019 0752   BILIRUBINUR NEGATIVE 01/22/2019 0752   KETONESUR NEGATIVE 01/22/2019 0752   PROTEINUR NEGATIVE 01/22/2019 0752   NITRITE NEGATIVE 01/22/2019 0752   LEUKOCYTESUR NEGATIVE 01/22/2019 0752     Microbiology No results found for  this or any previous visit (from the past 240 hour(s)).     Inpatient Medications:   Scheduled Meds: . enoxaparin (LOVENOX) injection  40 mg Subcutaneous Q24H  . feeding supplement  1 Container Oral TID BM  . pantoprazole (PROTONIX) IV  40 mg Intravenous QHS  . polyethylene glycol  17 g Oral BID   Continuous Infusions: . piperacillin-tazobactam (ZOSYN)  IV 3.375 g (01/26/19 0600)     Radiological Exams on Admission: No results found.  Impression/Recommendations Diverticulitis with microperforation: Patient diagnosed with diverticulitis with microperforation on 7/20 with.  He has been with being treated with IV Zosyn with improvement of symptoms. -Per general surgery  Chest pain: Patient reported acute onset of substernal chest pain this morning that hurts with deep inspiration.  Patient denies any significant family history of heart disease.  Suspect low risk for  pulmonary embolus as patient has been on DVT prophylaxis since admission.  Other things on the differential include  worsening perforation from diverticulitis vs. pneumonia vs. other -Check EKG(76 bpm without significant EKG changes) -Obtain troponin(pending) and d-dimer (1.46 which could be falsely elevated due to diverticulitis) -Check CT angiogram of the chest rule out PE -Also consider need Doppler ultrasound of lower extremities  COVID-19 screening: Patient appears low risk.  COVID -19 screening test was not performed on admission.  -Follow-up rapid COVID-19 screening  Thank you for this consultation.  Our [facility] hospitalist team will follow the patient with you.   Time Spent: 30-minute  Clydie Braun M.D. [facility] 01/26/2019, 11:08 AM

## 2019-01-26 NOTE — Progress Notes (Addendum)
D-dimer was noted to be elevated suspect this is likely reactive in nature.  Patient found to have a pneumonia of the superior segment of the right lower lung on CT angiogram of the chest, and no signs of pulmonary embolus.  This appears to likely correlate with patient's complaints of chest  pain radiating to his right shoulder blade.  He denies any cough. Troponin negative x1.  Continue incentive spirometry and Mucinex. Add-on pro calcitonin.  Still need to follow-up COVID-19 screening.

## 2019-01-26 NOTE — Progress Notes (Signed)
CC: Abdominal pain  Subjective: Complaining of chest pain.  Worried about COVID.  Says he does have hx of indigestion but this is different.  Worried he may take it home with him to family.  Still having chest pain with inspiration.  O2 sats 100% onRA  Objective: Vital signs in last 24 hours: Temp:  [97.7 F (36.5 C)-99.6 F (37.6 C)] 98.7 F (37.1 C) (08/03 0933) Pulse Rate:  [61-83] 79 (08/03 0933) Resp:  [16-20] 20 (08/03 0933) BP: (108-132)/(70-83) 126/82 (08/03 0933) SpO2:  [99 %-100 %] 100 % (08/03 0933) Last BM Date: 01/24/19 720 p.o. Voided x4 No BM listed. Afebrile vital signs are stable Labs are stable hemoglobin 4.1 CT scan 01/22/2019: Distal descending colon and proximal sigmoid diverticulitis with evidence of microperforation. There is soft tissue stranding and fluid in this area of acute diverticulitis without frank abscess.  Moderate diverticulosis without frank diverticulitis more distally in the sigmoid colon.  No bowel obstruction. No abscess in the abdomen or pelvis. Appendix appears normal.  No demonstrable renal or ureteral calculus. No hydronephrosis. Urinary bladder wall thickness is within normal limits.  Intake/Output from previous day: 08/02 0701 - 08/03 0700 In: 720 [P.O.:720] Out: 4 [Urine:4] Intake/Output this shift: No intake/output data recorded.  General appearance: alert, cooperative and no distress Resp: clear to auscultation bilaterally and complaining of chest pain with inspiration mid upper stenal area GI: soft, non tender, + Bs, tolerating diet and doing well from his diverticulitis  Lab Results:  Recent Labs    01/24/19 0400 01/26/19 0449  WBC 6.8 4.1  HGB 11.7* 11.9*  HCT 36.6* 37.2*  PLT 178 230    BMET Recent Labs    01/24/19 0400 01/26/19 0449  NA 137 140  K 4.0 3.9  CL 104 106  CO2 24 26  GLUCOSE 76 96  BUN 8 6  CREATININE 1.31* 0.99  CALCIUM 8.8* 8.7*   PT/INR No results for input(s): LABPROT, INR  in the last 72 hours.  Recent Labs  Lab 01/22/19 0752  AST 16  ALT 26  ALKPHOS 60  BILITOT 0.5  PROT 6.4*  ALBUMIN 4.1     Lipase     Component Value Date/Time   LIPASE 27 01/22/2019 0752     Medications: . enoxaparin (LOVENOX) injection  40 mg Subcutaneous Q24H  . feeding supplement  1 Container Oral TID BM  . pantoprazole (PROTONIX) IV  40 mg Intravenous QHS  . polyethylene glycol  17 g Oral BID   . sodium chloride 100 mL/hr at 01/25/19 1235  . piperacillin-tazobactam (ZOSYN)  IV 3.375 g (01/26/19 0600)   Assessment/Plan Incidental lung nodule seen on CT  Sigmoid diverticulitis with microperforation - No indication for emergent surgery  - IV abx, continue - Recommend followup with colorectal surgeon and GI to have colonoscopy in 6-8 weeks if resolves with conservative management.  -  ID- Zosyn 7/30 >> day 5 VTE -SCDs, Lovenox FEN -soft diet 8/2/IV fluids POC - Matthew English   Plan:  Mylanta 30 CC ASAP, I have called Medicine to see and evaluate for COVID.  It does not seem to be cardiac issue, but will get and EKG.  I don't want to send him down for x-ray till Medicine see's him.  I think from our standpoint we could switch him over to PO abx and send home but will continue IV until we send him home.  I will stop his IV fluids and saline lock IV.  LOS: 3 days    Matthew English 01/26/2019 [phone]

## 2019-01-27 DIAGNOSIS — J189 Pneumonia, unspecified organism: Secondary | ICD-10-CM | POA: Diagnosis present

## 2019-01-27 DIAGNOSIS — R911 Solitary pulmonary nodule: Secondary | ICD-10-CM | POA: Diagnosis present

## 2019-01-27 MED ORDER — AZITHROMYCIN 500 MG PO TABS: 500.0000 mg | ORAL_TABLET | Freq: Every day | ORAL | 0 refills | Status: AC

## 2019-01-27 MED ORDER — ACETAMINOPHEN 325 MG PO TABS: 650.0000 mg | ORAL_TABLET | Freq: Four times a day (QID) | ORAL | Status: DC | PRN

## 2019-01-27 MED ORDER — OXYCODONE HCL 5 MG PO TABS: 5.0000 mg | ORAL_TABLET | Freq: Four times a day (QID) | ORAL | 0 refills | Status: DC | PRN

## 2019-01-27 MED ORDER — SACCHAROMYCES BOULARDII 250 MG PO CAPS: ORAL_CAPSULE | ORAL | Status: DC

## 2019-01-27 MED ORDER — GUAIFENESIN ER 600 MG PO TB12: 600.0000 mg | ORAL_TABLET | Freq: Two times a day (BID) | ORAL | 0 refills | Status: AC

## 2019-01-27 MED ORDER — AZITHROMYCIN 500 MG PO TABS
500.0000 mg | ORAL_TABLET | Freq: Every day | ORAL | Status: DC
  Administered 2019-01-27: 12:00:00 500 mg via ORAL
  Filled 2019-01-27: qty 1

## 2019-01-27 MED ORDER — AMOXICILLIN-POT CLAVULANATE 875-125 MG PO TABS: 1.0000 | ORAL_TABLET | Freq: Two times a day (BID) | ORAL | 0 refills | Status: AC

## 2019-01-27 NOTE — Plan of Care (Signed)
  Problem: Pain Managment: Goal: General experience of comfort will improve Outcome: Progressing   Problem: Nutrition: Goal: Adequate nutrition will be maintained Outcome: Progressing   

## 2019-01-27 NOTE — Progress Notes (Signed)
    CC: abdominal pain and now chest pain   Subjective: Pt continues to have chest pain with inspiration and is afraid to go home, wants to stay till Friday so he does not infect the family.  He has done a Pro[employer], television/film/video and is convinced he can spread it to his family.  I have sent a message to Medicine and they will see later today and discuss. Minimal tenderness in abdomen, tolerating diet and having BM's.  Objective: Vital signs in last 24 hours: Temp:  [98.2 F (36.8 C)-99.3 F (37.4 C)] 98.2 F (36.8 C) (08/04 0534) Pulse Rate:  [58-94] 58 (08/04 0534) Resp:  [16-20] 16 (08/04 0534) BP: (97-126)/(62-83) 120/72 (08/04 0534) SpO2:  [100 %] 100 % (08/04 0534) Last BM Date: 01/26/19 1080 PO Urine x 2 Afebrile, VSS, O2 sats 100% room air Trop <2 & 3 (norm <18) Procalcitonin 0.24(norm <0.5) CT scan 8/3:   Pneumonia in the superior segment right lower lobe. No demonstrable pulmonary embolus. No thoracic aortic aneurysm or dissection.  Prominence of the main pulmonary outflow tract, a finding felt to indicate pulmonary arterial hypertension.  4 mm nodular opacity abutting the pleura in the right upper lobe. No follow-up needed if patient is low-risk.  Intake/Output from previous day: 08/03 0701 - 08/04 0700 In: 1080 [P.O.:1080] Out: 2 [Urine:2] Intake/Output this shift: No intake/output data recorded.  General appearance: alert, cooperative and no distress Resp: clear to auscultation bilaterally GI: soft he says you have to press really hard to feel discomfort LLQ.  Lab Results:  Recent Labs    01/26/19 0449  WBC 4.1  HGB 11.9*  HCT 37.2*  PLT 230    BMET Recent Labs    01/26/19 0449  NA 140  K 3.9  CL 106  CO2 26  GLUCOSE 96  BUN 6  CREATININE 0.99  CALCIUM 8.7*   PT/INR No results for input(s): LABPROT, INR in the last 72 hours.  Recent Labs  Lab 01/22/19 0752  AST 16  ALT 26  ALKPHOS 60  BILITOT 0.5  PROT 6.4*  ALBUMIN 4.1      Lipase     Component Value Date/Time   LIPASE 27 01/22/2019 0752     Medications: . enoxaparin (LOVENOX) injection  40 mg Subcutaneous Q24H  . feeding supplement  1 Container Oral TID BM  . guaiFENesin  600 mg Oral BID  . pantoprazole (PROTONIX) IV  40 mg Intravenous QHS  . polyethylene glycol  17 g Oral BID    Assessment/Plan Incidental lung nodule RUL seen on CT COVID negative 01/26/19  Sigmoid diverticulitis with microperforation - No indication for emergent surgery  - IV abx, continue - Recommend followup with colorectal surgeon and GI to have colonoscopy in 6-8 weeks if resolves with conservative management.   Chest pain  - RLL pneumonia -  ID- Zosyn 7/30 >> day 6 VTE -SCDs, Lovenox FEN -soft diet 8/2/IV fluids POC - Unice Bailey  Plan: Will discuss with Medicine and Dr. Rosendo Gros.         LOS: 4 days    Salimatou Simone 01/27/2019 [phone]
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
## 2019-01-27 NOTE — Discharge Instructions (Signed)
Please make sure to complete all of your antibiotics Please make sure to make a follow up appointment/establish care with a primary care provider You will need to follow up with one of our colorectal surgeons. You will need a follow up colonoscopy in 4-6 weeks.  If the pain returns and you need more than Tylenol you need to call and let us see you sooner.   Diverticulitis   Diverticulitis is when small pockets in your large intestine (colon) get infected or swollen. This causes stomach pain and watery poop (diarrhea). These pouches are called diverticula. They form in people who have a condition called diverticulosis. Follow these instructions at home: Medicines  Take over-the-counter and prescription medicines only as told by your doctor. These include: ? Antibiotics. ? Pain medicines. ? Fiber pills. ? Probiotics. ? Stool softeners.  Do not drive or use heavy machinery while taking prescription pain medicine.  If you were prescribed an antibiotic, take it as told. Do not stop taking it even if you feel better. General instructions   Follow a diet as told by your doctor.  When you feel better, your doctor may tell you to change your diet. You may need to eat a lot of fiber. Fiber makes it easier to poop (have bowel movements). Healthy foods with fiber include: ? Berries. ? Beans. ? Lentils. ? Green vegetables.  Exercise 3 or more times a week. Aim for 30 minutes each time. Exercise enough to sweat and make your heart beat faster.  Keep all follow-up visits as told. This is important. You may need to have an exam of the large intestine. This is called a colonoscopy. Contact a doctor if:  Your pain does not get better.  You have a hard time eating or drinking.  You are not pooping like normal. Get help right away if:  Your pain gets worse.  Your problems do not get better.  Your problems get worse very fast.  You have a fever.  You throw up (vomit) more than one  time.  You have poop that is: ? Bloody. ? Black. ? Tarry. Summary  Diverticulitis is when small pockets in your large intestine (colon) get infected or swollen.  Take medicines only as told by your doctor.  Follow a diet as told by your doctor. This information is not intended to replace advice given to you by your health care provider. Make sure you discuss any questions you have with your health care provider. You need to contact a primary care doctor to follow up you pneumonia and you need an repeat CT scan for your chest in 1 year.   Document Released: 11/28/2007 Document Revised: 05/24/2017 Document Reviewed: 06/28/2016 Elsevier Patient Education  2020 Elsevier Inc.   Soft-Food Eating Plan (please follow this eating plan for the next 4 weeks) A soft-food eating plan includes foods that are safe and easy to chew and swallow. Your health care provider or dietitian can help you find foods and flavors that fit into this plan. Follow this plan until your health care provider or dietitian says it is safe to start eating other foods and food textures. What are tips for following this plan? General guidelines   Take small bites of food, or cut food into pieces about  inch or smaller. Bite-sized pieces of food are easier to chew and swallow.  Eat moist foods. Avoid overly dry foods.  Avoid foods that: ? Are difficult to swallow, such as dry, chunky, crispy, or sticky foods. ?  Are difficult to chew, such as hard, tough, or stringy foods. ? Contain nuts, seeds, or fruits.  Follow instructions from your dietitian about the types of liquids that are safe for you to swallow. You may be allowed to have: ? Thick liquids only. This includes only liquids that are thicker than honey. ? Thin and thick liquids. This includes all beverages and foods that become liquid at room temperature.  To make thick liquids: ? Purchase a commercial liquid thickening powder. These are available at  grocery stores and pharmacies. ? Mix the thickener into liquids according to instructions on the label. ? Purchase ready-made thickened liquids. ? Thicken soup by pureeing, straining to remove chunks, and adding flour, potato flakes, or corn starch. ? Add commercial thickener to foods that become liquid at room temperature, such as milk shakes, yogurt, ice cream, gelatin, and sherbet.  Ask your health care provider whether you need to take a fiber supplement. Cooking  Cook meats so they stay tender and moist. Use methods like braising, stewing, or baking in liquid.  Cook vegetables and fruit until they are soft enough to be mashed with a fork.  Peel soft, fresh fruits such as peaches, nectarines, and melons.  When making soup, make sure chunks of meat and vegetables are smaller than  inch.  Reheat leftover foods slowly so that a tough crust does not form. What foods are allowed? The items listed below may not be a complete list. Talk with your dietitian about what dietary choices are best for you. Grains Breads, muffins, pancakes, or waffles moistened with syrup, jelly, or butter. Dry cereals well-moistened with milk. Moist, cooked cereals. Well-cooked pasta and rice. Vegetables All soft-cooked vegetables. Shredded lettuce. Fruits All canned and cooked fruits. Soft, peeled fresh fruits. Strawberries. Dairy Milk. Cream. Yogurt. Cottage cheese. Soft cheese without the rind. Meats and other protein foods Tender, moist ground meat, poultry, or fish. Meat cooked in gravy or sauces. Eggs. Sweets and desserts Ice cream. Milk shakes. Sherbet. Pudding. Fats and oils Butter. Margarine. Olive, canola, sunflower, and grapeseed oil. Smooth salad dressing. Smooth cream cheese. Mayonnaise. Gravy. What foods are not allowed? The items listed bemay not be a complete list. Talk with your dietitian about what dietary choices are best for you. Grains Coarse or dry cereals, such as bran, granola,  and shredded wheat. Tough or chewy crusty breads, such as [place] bread or baguettes. Breads with nuts, seeds, or fruit. Vegetables All raw vegetables. Cooked corn. Cooked vegetables that are tough or stringy. Tough, crisp, fried potatoes and potato skins. Fruits Fresh fruits with skins or seeds, or both, such as apples, pears, and grapes. Stringy, high-pulp fruits, such as papaya, pineapple, coconut, and mango. Fruit leather and all dried fruit. Dairy Yogurt with nuts or coconut. Meats and other protein foods Hard, dry sausages. Dry meat, poultry, or fish. Meats with gristle. Fish with bones. Fried meat or fish. Lunch meat and hotdogs. Nuts and seeds. Chunky peanut butter or other nut butters. Sweets and desserts Cakes or cookies that are very dry or chewy. Desserts with dried fruit, nuts, or coconut. Fried pastries. Very rich pastries. Fats and oils Cream cheese with fruit or nuts. Salad dressings with seeds or chunks. Summary  A soft-food eating plan includes foods that are safe and easy to swallow. Generally, the foods should be soft enough to be mashed with a fork.  Avoid foods that are dry, hard to chew, crunchy, sticky, stringy, or crispy.  Ask your health care provider whether  you need to thicken your liquids and if you need to take a fiber supplement. This information is not intended to replace advice given to you by your health care provider. Make sure you discuss any questions you have with your health care provider. Document Released: 09/18/2007 Document Revised: 10/02/2018 Document Reviewed: 08/14/2016 Elsevier Patient Education  2020 Elsevier Inc.    High-Fiber Diet (please follow a high fiber eating plan starting in 4 weeks) Fiber, also called dietary fiber, is a type of carbohydrate that is found in fruits, vegetables, whole grains, and beans. A high-fiber diet can have many health benefits. Your health care provider may recommend a high-fiber diet to help:  Prevent  constipation. Fiber can make your bowel movements more regular.  Lower your cholesterol.  Relieve the following conditions: ? Swelling of veins in the anus (hemorrhoids). ? Swelling and irritation (inflammation) of specific areas of the digestive tract (uncomplicated diverticulosis). ? A problem of the large intestine (colon) that sometimes causes pain and diarrhea (irritable bowel syndrome, IBS).  Prevent overeating as part of a weight-loss plan.  Prevent heart disease, type 2 diabetes, and certain cancers. What is my plan? The recommended daily fiber intake in grams (g) includes:  38 g for men age 31 or younger.  30 g for men over age 31.  25 g for women age 31 or younger.  21 g for women over age 31. You can get the recommended daily intake of dietary fiber by:  Eating a variety of fruits, vegetables, grains, and beans.  Taking a fiber supplement, if it is not possible to get enough fiber through your diet. What do I need to know about a high-fiber diet?  It is better to get fiber through food sources rather than from fiber supplements. There is not a lot of research about how effective supplements are.  Always check the fiber content on the nutrition facts label of any prepackaged food. Look for foods that contain 5 g of fiber or more per serving.  Talk with a diet and nutrition specialist (dietitian) if you have questions about specific foods that are recommended or not recommended for your medical condition, especially if those foods are not listed below.  Gradually increase how much fiber you consume. If you increase your intake of dietary fiber too quickly, you may have bloating, cramping, or gas.  Drink plenty of water. Water helps you to digest fiber. What are tips for following this plan?  Eat a wide variety of high-fiber foods.  Make sure that half of the grains that you eat each day are whole grains.  Eat breads and cereals that are made with whole-grain flour  instead of refined flour or white flour.  Eat brown rice, bulgur wheat, or millet instead of white rice.  Start the day with a breakfast that is high in fiber, such as a cereal that contains 5 g of fiber or more per serving.  Use beans in place of meat in soups, salads, and pasta dishes.  Eat high-fiber snacks, such as berries, raw vegetables, nuts, and popcorn.  Choose whole fruits and vegetables instead of processed forms like juice or sauce. What foods can I eat?  Fruits Berries. Pears. Apples. Oranges. Avocado. Prunes and raisins. Dried figs. Vegetables Sweet potatoes. Spinach. Kale. Artichokes. Cabbage. Broccoli. Cauliflower. Green peas. Carrots. Squash. Grains Whole-grain breads. Multigrain cereal. Oats and oatmeal. Brown rice. Barley. Bulgur wheat. Millet. Quinoa. Bran muffins. Popcorn. Rye wafer crackers. Meats and other proteins Navy, kidney, and  pinto beans. Soybeans. Split peas. Lentils. Nuts and seeds. Dairy Fiber-fortified yogurt. Beverages Fiber-fortified soy milk. Fiber-fortified orange juice. Other foods Fiber bars. The items listed above may not be a complete list of recommended foods and beverages. Contact a dietitian for more options. What foods are not recommended? Fruits Fruit juice. Cooked, strained fruit. Vegetables Fried potatoes. Canned vegetables. Well-cooked vegetables. Grains White bread. Pasta made with refined flour. White rice. Meats and other proteins Fatty cuts of meat. Fried chicken or fried fish. Dairy Milk. Yogurt. Cream cheese. Sour cream. Fats and oils Butters. Beverages Soft drinks. Other foods Cakes and pastries. The items listed above may not be a complete list of foods and beverages to avoid. Contact a dietitian for more information. Summary  Fiber is a type of carbohydrate. It is found in fruits, vegetables, whole grains, and beans.  There are many health benefits of eating a high-fiber diet, such as preventing constipation,  lowering blood cholesterol, helping with weight loss, and reducing your risk of heart disease, diabetes, and certain cancers.  Gradually increase your intake of fiber. Increasing too fast can result in cramping, bloating, and gas. Drink plenty of water while you increase your fiber.  The best sources of fiber include whole fruits and vegetables, whole grains, nuts, seeds, and beans. This information is not intended to replace advice given to you by your health care provider. Make sure you discuss any questions you have with your health care provider. Document Released: 06/11/2005 Document Revised: 04/15/2017 Document Reviewed: 04/15/2017 Elsevier Patient Education  2020 [employer].

## 2019-01-27 NOTE — Progress Notes (Signed)
PROGRESS NOTE    Collis Fleming  VWU:[id number] DOB: 1985/08/08 DOA: 01/22/2019 PCP: Patient, No Pcp Per    Brief Narrative:   Harlin Bahri is an 33 y.o. male with past medical history of diverticulitis; who presented to the emergency department on 7/30 with complaints of intermittent left-sided abdominal pain over the last month.  CT scan revealed distal descending colon and proximal sigmoid colon diverticulitis with evidence of microperforation.  Last noted to have diverticulitis back in 2018 with abscess that was noted to be due to small for drainage at that time.  Patient has been treated with IV Zosyn with improvement of abdominal pain symptoms.  He has currently been in the process of advancing his diet.  However, this morning complained of substernal chest pain that radiates to his right shoulder.  Symptoms worse with taking a deep inspiratory breath.  Pain feels different than heartburn.  Associated symptoms include shortness of breath and aching knees then he relates possibly to the hospital bed.  Denies having any cough, fever, leg swelling, or significant family history of cardiac disease.Marland Kitchen  He had been social distancing while [employer]ing for the [employer], and denies any recent sick contacts to his knowledge.  He requests to be tested for COVID 19 because it was not preformed when he was admitted into the hospital.  He would like to make sure that he does not have COVID-19 because his parents are older and staying in the same household.  Assessment & Plan:   Principal Problem:   Diverticulitis Active Problems:   Right lower lobe pneumonia (HCC)   Nodule of upper lobe of right lung   Diverticulitis with microperforation Patient presenting with abdominal pain found to have diverticulitis with microperforation.  Continues on empiric antibiotics with IV Zosyn per general surgery.  No indication of surgical intervention at this time.  They recommend follow-up with colorectal surgery and have GI  perform colonoscopy in 6-8 weeks.  Right lower lobe pneumonia Patient started complaining of pleuritic chest pain on 01/26/2019.  Medicine service consulted for assistance in management and evaluation.  Patient has been afebrile without leukocytosis.  EKG with NSR at 76 bpm and no concerning ST elevation/depression or T wave inversions.  Patient's d-dimer was elevated 1.46, which could be related to underlying infection with diverticulitis.  CT angiogram PE study was negative for pulmonary embolism but did note a right lower lobe infiltrate.  COVID-19 test was negative. --Continues on IV Zosyn as above for diverticulitis --Will add azithromycin 500 mg p.o. daily x5 days to cover for atypical infection  Right upper lobe lung nodule Incidental 4 mm nodule density noted on CTA PE scan.  Recommend follow-up with 81-month noncontrasted CT for interval surveillance.  Disposition: In regards to his underlying pneumonia, okay from medicine standpoint for discharge home as he is afebrile, no leukocytosis and no cough.  He is low risk for transmission to other individuals in regards to his pneumonia.  Recommend patient maintain social distancing and facemask when around other individuals.  On behalf of the [facility] team, we appreciate the opportunity to participate in the care of this patient.  We will continue to follow along with you.   DVT prophylaxis: Lovenox Code Status: Full code Family Communication: None Disposition Plan: Per general surgery   Antimicrobials:   Zosyn  Azithromycin 8/4>> plan 5-day course   Subjective: Patient seen and examined at bedside, sitting on couch in room.  No active concerns this morning other than about discharging home too  quickly and possible transmission to his family including his son.  Discussed with patient extensively this morning that he is afebrile without leukocytosis and has no cough and the risk of transmission is extremely low and he would be  safe for discharge on our standpoint at any time.  Patient denies headache, no fever/chills/night sweats, no nauseous vomiting/diarrhea, no shortness of breath, no palpitations, no abdominal pain, no weakness, no paresthesias.  No acute events overnight per nursing staff.  Discussed with surgery team, Will Marlyne Beards regarding plan to initiate azithromycin x5 days, and will need follow-up CT 12 months for incidental finding of a lung nodule.  Okay from medicine standpoint for discharge home at any time.  Objective: Vitals:   01/26/19 1552 01/26/19 1958 01/27/19 0534 01/27/19 0841  BP: 97/62 123/83 120/72 129/76  Pulse: 94 71 (!) 58 80  Resp: 16 16 16 16   Temp: 99.3 F (37.4 C) 98.5 F (36.9 C) 98.2 F (36.8 C) 98.2 F (36.8 C)  TempSrc: Oral Oral Oral Oral  SpO2: 100% 100% 100% 100%  Height:        Intake/Output Summary (Last 24 hours) at 01/27/2019 1105 Last data filed at 01/27/2019 1044 Gross per 24 hour  Intake 1278.78 ml  Output 1 ml  Net 1277.78 ml   There were no vitals filed for this visit.  Examination:  General exam: Appears calm and comfortable  Respiratory system: Clear to auscultation. Respiratory effort normal. Cardiovascular system: S1 & S2 heard, RRR. No JVD, murmurs, rubs, gallops or clicks. No pedal edema. Gastrointestinal system: Abdomen is nondistended, soft and nontender. No organomegaly or masses felt. Normal bowel sounds heard. Central nervous system: Alert and oriented. No focal neurological deficits. Extremities: Symmetric 5 x 5 power. Skin: No rashes, lesions or ulcers Psychiatry: Judgement and insight appear normal. Mood & affect appropriate.     Data Reviewed: I have personally reviewed following labs and imaging studies  CBC: Recent Labs  Lab 01/22/19 0752 01/23/19 0551 01/24/19 0400 01/26/19 0449  WBC 7.8 10.0 6.8 4.1  HGB 14.0 12.1* 11.7* 11.9*  HCT 42.6 36.8* 36.6* 37.2*  MCV 88.0 88.2 89.9 88.2  PLT 226 170 178 230   Basic Metabolic  Panel: Recent Labs  Lab 01/22/19 0752 01/23/19 0551 01/24/19 0400 01/26/19 0449  NA 139 137 137 140  K 3.8 3.1* 4.0 3.9  CL 106 105 104 106  CO2 22 24 24 26   GLUCOSE 96 101* 76 96  BUN 13 10 8 6   CREATININE 1.06 1.21 1.31* 0.99  CALCIUM 9.3 8.5* 8.8* 8.7*   GFR: CrCl cannot be calculated (Unknown ideal weight.). Liver Function Tests: Recent Labs  Lab 01/22/19 0752  AST 16  ALT 26  ALKPHOS 60  BILITOT 0.5  PROT 6.4*  ALBUMIN 4.1   Recent Labs  Lab 01/22/19 0752  LIPASE 27   No results for input(s): AMMONIA in the last 168 hours. Coagulation Profile: No results for input(s): INR, PROTIME in the last 168 hours. Cardiac Enzymes: No results for input(s): CKTOTAL, CKMB, CKMBINDEX, TROPONINI in the last 168 hours. BNP (last 3 results) No results for input(s): PROBNP in the last 8760 hours. HbA1C: No results for input(s): HGBA1C in the last 72 hours. CBG: No results for input(s): GLUCAP in the last 168 hours. Lipid Profile: No results for input(s): CHOL, HDL, LDLCALC, TRIG, CHOLHDL, LDLDIRECT in the last 72 hours. Thyroid Function Tests: No results for input(s): TSH, T4TOTAL, FREET4, T3FREE, THYROIDAB in the last 72 hours. Anemia Panel: No  results for input(s): VITAMINB12, FOLATE, FERRITIN, TIBC, IRON, RETICCTPCT in the last 72 hours. Sepsis Labs: Recent Labs  Lab 01/26/19 1506  PROCALCITON 0.24    Recent Results (from the past 240 hour(s))  SARS Coronavirus 2 [facility] order, Performed in [facility] hospital lab) Nasopharyngeal Nasopharyngeal Swab     Status: None   Collection Time: 01/26/19  2:44 PM   Specimen: Nasopharyngeal Swab  Result Value Ref Range Status   SARS Coronavirus 2 NEGATIVE NEGATIVE Final    Comment: (NOTE) If result is NEGATIVE SARS-CoV-2 target nucleic acids are NOT DETECTED. The SARS-CoV-2 RNA is generally detectable in upper and lower  respiratory specimens during the acute phase of infection. The lowest  concentration of SARS-CoV-2  viral copies this assay can detect is 250  copies / mL. A negative result does not preclude SARS-CoV-2 infection  and should not be used as the sole basis for treatment or other  patient management decisions.  A negative result may occur with  improper specimen collection / handling, submission of specimen other  than nasopharyngeal swab, presence of viral mutation(s) within the  areas targeted by this assay, and inadequate number of viral copies  (<250 copies / mL). A negative result must be combined with clinical  observations, patient history, and epidemiological information. If result is POSITIVE SARS-CoV-2 target nucleic acids are DETECTED. The SARS-CoV-2 RNA is generally detectable in upper and lower  respiratory specimens dur ing the acute phase of infection.  Positive  results are indicative of active infection with SARS-CoV-2.  Clinical  correlation with patient history and other diagnostic information is  necessary to determine patient infection status.  Positive results do  not rule out bacterial infection or co-infection with other viruses. If result is PRESUMPTIVE POSTIVE SARS-CoV-2 nucleic acids MAY BE PRESENT.   A presumptive positive result was obtained on the submitted specimen  and confirmed on repeat testing.  While 2019 novel coronavirus  (SARS-CoV-2) nucleic acids may be present in the submitted sample  additional confirmatory testing may be necessary for epidemiological  and / or clinical management purposes  to differentiate between  SARS-CoV-2 and other Sarbecovirus currently known to infect humans.  If clinically indicated additional testing with an alternate test  methodology [phone]) is advised. The SARS-CoV-2 RNA is generally  detectable in upper and lower respiratory sp ecimens during the acute  phase of infection. The expected result is Negative. Fact Sheet for Patients:  BoilerBrush.com.cy Fact Sheet for Healthcare  Providers: https://pope.com/ This test is not yet approved or cleared by the [place] FDA and has been authorized for detection and/or diagnosis of SARS-CoV-2 by FDA under an Emergency Use Authorization (EUA).  This EUA will remain in effect (meaning this test can be used) for the duration of the COVID-19 declaration under Section 564(b)(1) of the Act, 21 U.S.C. section 360bbb-3(b)(1), unless the authorization is terminated or revoked sooner. Performed at [facility], [address] [place], [place] [address]          Radiology Studies: Ct Angio Chest Pe W Or Wo Contrast  Result Date: 01/26/2019 CLINICAL DATA:  Shortness of breath and chest pain EXAM: CT ANGIOGRAPHY CHEST WITH CONTRAST TECHNIQUE: Multidetector CT imaging of the chest was performed using the standard protocol during bolus administration of intravenous contrast. Multiplanar CT image reconstructions and MIPs were obtained to evaluate the vascular anatomy. CONTRAST:  80mL OMNIPAQUE IOHEXOL 350 MG/ML SOLN COMPARISON:  None. FINDINGS: Cardiovascular: There is no demonstrable pulmonary embolus. There is no  thoracic aortic aneurysm or dissection. Visualized great vessels appear unremarkable. There is no apparent pericardial effusion or pericardial thickening. The main pulmonary outflow tract measures 3.1 cm, prominent. Mediastinum/Nodes: Visualized thyroid appears unremarkable. There 1is no demonstrable adenopathy. No esophageal lesions are evident. Lungs/Pleura: There is airspace consolidation in the superior segment right lower lobe consistent with pneumonia. There is a nodular opacity abutting the pleura in the posterior segment of the left upper lobe measuring 4 mm seen on axial slice 59 series 6. Lungs elsewhere are clear. No pleural effusions evident. Upper Abdomen: Visualized upper abdominal structures appear unremarkable. Musculoskeletal: There are no blastic or lytic bone lesions. No chest  wall lesions evident. Review of the MIP images confirms the above findings. IMPRESSION: 1.  Pneumonia in the superior segment right lower lobe. 2. No demonstrable pulmonary embolus. No thoracic aortic aneurysm or dissection. 3. Prominence of the main pulmonary outflow tract, a finding felt to indicate pulmonary arterial hypertension. 4. 4 mm nodular opacity abutting the pleura in the right upper lobe. No follow-up needed if patient is low-risk. Non-contrast chest CT can be considered in 12 months if patient is high-risk. This recommendation follows the consensus statement: Guidelines for Management of Incidental Pulmonary Nodules Detected on CT Images: From the Fleischner Society 2017; Radiology 2017; 284:228-243. Electronically Signed   By: Bretta Bang III M.D.   On: 01/26/2019 13:47        Scheduled Meds:  azithromycin  500 mg Oral Daily   enoxaparin (LOVENOX) injection  40 mg Subcutaneous Q24H   feeding supplement  1 Container Oral TID BM   guaiFENesin  600 mg Oral BID   pantoprazole (PROTONIX) IV  40 mg Intravenous QHS   polyethylene glycol  17 g Oral BID   Continuous Infusions:  piperacillin-tazobactam (ZOSYN)  IV Stopped (01/27/19 1010)     LOS: 4 days    Time spent: 35 minutes spent on chart review, personally reviewing all imaging/labs, discussion with nursing staff, consultants, and interview/physical exam; more than 50% of that time was spent in counseling and/or coordination of care.    Alvira Philips [place], DO [facility] Pager [phone]  If 7PM-7AM, please contact night-coverage [email or web address] Password [email or web address] 01/27/2019, 11:05 AM

## 2019-01-27 NOTE — Progress Notes (Signed)
Provided discharge education/instructions, all questions and concerns addressed, Pt not in distress. Handed MATCH letter from CM to Pt, to discharge home with belongings accompanied by family.

## 2019-01-27 NOTE — Plan of Care (Signed)
  Problem: Clinical Measurements: Goal: Ability to maintain clinical measurements within normal limits will improve Outcome: Progressing   Problem: Activity: Goal: Risk for activity intolerance will decrease Outcome: Progressing   Problem: Nutrition: Goal: Adequate nutrition will be maintained Outcome: Progressing   Problem: Coping: Goal: Level of anxiety will decrease Outcome: Progressing   Problem: Elimination: Goal: Will not experience complications related to bowel motility Outcome: Progressing   Problem: Safety: Goal: Ability to remain free from injury will improve Outcome: Progressing   Problem: Skin Integrity: Goal: Risk for impaired skin integrity will decrease Outcome: Progressing   

## 2020-01-05 ENCOUNTER — Ambulatory Visit (HOSPITAL_COMMUNITY)
Admission: RE | Admit: 2020-01-05 | Discharge: 2020-01-05 | Disposition: A | Discharge status: Home / Self Care | Payer: Federal, State, Local not specified - PPO | Source: Ambulatory Visit | Attending: Family Medicine | Admitting: Family Medicine

## 2020-01-05 ENCOUNTER — Other Ambulatory Visit: Payer: Self-pay

## 2020-01-05 ENCOUNTER — Ambulatory Visit (INDEPENDENT_AMBULATORY_CARE_PROVIDER_SITE_OTHER): Payer: Federal, State, Local not specified - PPO

## 2020-01-05 ENCOUNTER — Encounter (HOSPITAL_COMMUNITY): Payer: Self-pay

## 2020-01-05 VITALS — BP 130/82 | HR 69 | Temp 98.5°F | Resp 16

## 2020-01-05 DIAGNOSIS — R079 Chest pain, unspecified: Secondary | ICD-10-CM

## 2020-01-05 DIAGNOSIS — R05 Cough: Secondary | ICD-10-CM | POA: Diagnosis not present

## 2020-01-05 DIAGNOSIS — R059 Cough, unspecified: Secondary | ICD-10-CM

## 2020-01-05 DIAGNOSIS — R509 Fever, unspecified: Secondary | ICD-10-CM | POA: Diagnosis not present

## 2020-01-05 MED ORDER — FLUTICASONE PROPIONATE 50 MCG/ACT NA SUSP
1.0000 | Freq: Every day | NASAL | 2 refills | Status: DC
Start: 2020-01-05 — End: 2021-09-23

## 2020-01-05 MED ORDER — CETIRIZINE HCL 10 MG PO TABS: 10.0000 mg | ORAL_TABLET | Freq: Every day | ORAL | 0 refills | Status: DC

## 2020-01-05 NOTE — Discharge Instructions (Addendum)
Your EKG and chest x-ray were normal Symptoms could be related to some sort of upper respiratory virus versus allergies.   Recommend Zyrtec and Flonase daily Follow up as needed for continued or worsening symptoms
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
## 2020-01-18 ENCOUNTER — Encounter (HOSPITAL_COMMUNITY): Payer: Self-pay | Admitting: Emergency Medicine

## 2020-01-18 ENCOUNTER — Emergency Department (HOSPITAL_COMMUNITY): Payer: Federal, State, Local not specified - PPO

## 2020-01-18 ENCOUNTER — Emergency Department (HOSPITAL_COMMUNITY)
Admission: EM | Admit: 2020-01-18 | Discharge: 2020-01-18 | Disposition: A | Discharge status: Home / Self Care | Payer: Federal, State, Local not specified - PPO | Attending: Emergency Medicine | Admitting: Emergency Medicine

## 2020-01-18 DIAGNOSIS — Y939 Activity, unspecified: Secondary | ICD-10-CM | POA: Insufficient documentation

## 2020-01-18 DIAGNOSIS — R519 Headache, unspecified: Secondary | ICD-10-CM | POA: Diagnosis not present

## 2020-01-18 DIAGNOSIS — S0993XA Unspecified injury of face, initial encounter: Secondary | ICD-10-CM | POA: Diagnosis not present

## 2020-01-18 DIAGNOSIS — M25531 Pain in right wrist: Secondary | ICD-10-CM | POA: Insufficient documentation

## 2020-01-18 DIAGNOSIS — Y999 Unspecified external cause status: Secondary | ICD-10-CM | POA: Diagnosis not present

## 2020-01-18 DIAGNOSIS — Y929 Unspecified place or not applicable: Secondary | ICD-10-CM | POA: Insufficient documentation

## 2020-01-18 DIAGNOSIS — S63501A Unspecified sprain of right wrist, initial encounter: Secondary | ICD-10-CM | POA: Diagnosis not present

## 2020-01-18 DIAGNOSIS — S0285XA Fracture of orbit, unspecified, initial encounter for closed fracture: Secondary | ICD-10-CM

## 2020-01-18 DIAGNOSIS — M25511 Pain in right shoulder: Secondary | ICD-10-CM | POA: Insufficient documentation

## 2020-01-18 DIAGNOSIS — Z23 Encounter for immunization: Secondary | ICD-10-CM | POA: Insufficient documentation

## 2020-01-18 DIAGNOSIS — S01121A Laceration with foreign body of right eyelid and periocular area, initial encounter: Secondary | ICD-10-CM | POA: Diagnosis not present

## 2020-01-18 DIAGNOSIS — R55 Syncope and collapse: Secondary | ICD-10-CM | POA: Diagnosis not present

## 2020-01-18 DIAGNOSIS — W228XXA Striking against or struck by other objects, initial encounter: Secondary | ICD-10-CM | POA: Diagnosis not present

## 2020-01-18 DIAGNOSIS — S199XXA Unspecified injury of neck, initial encounter: Secondary | ICD-10-CM | POA: Diagnosis not present

## 2020-01-18 DIAGNOSIS — S0231XA Fracture of orbital floor, right side, initial encounter for closed fracture: Secondary | ICD-10-CM | POA: Diagnosis not present

## 2020-01-18 DIAGNOSIS — M542 Cervicalgia: Secondary | ICD-10-CM | POA: Diagnosis not present

## 2020-01-18 DIAGNOSIS — S0181XA Laceration without foreign body of other part of head, initial encounter: Secondary | ICD-10-CM

## 2020-01-18 DIAGNOSIS — S0990XA Unspecified injury of head, initial encounter: Secondary | ICD-10-CM | POA: Diagnosis not present

## 2020-01-18 MED ORDER — TETANUS-DIPHTH-ACELL PERTUSSIS 5-2.5-18.5 LF-MCG/0.5 IM SUSP
0.5000 mL | INTRAMUSCULAR | Status: AC | PRN
  Administered 2020-01-18: 0.5 mL via INTRAMUSCULAR
  Filled 2020-01-18: qty 0.5

## 2020-01-18 MED ORDER — TETRACAINE HCL 0.5 % OP SOLN
1.0000 [drp] | Freq: Once | OPHTHALMIC | Status: AC
  Administered 2020-01-18: 1 [drp] via OPHTHALMIC
  Filled 2020-01-18: qty 4

## 2020-01-18 MED ORDER — FLUORESCEIN SODIUM 1 MG OP STRP
1.0000 | ORAL_STRIP | Freq: Once | OPHTHALMIC | Status: AC
  Administered 2020-01-18: 1 via OPHTHALMIC
  Filled 2020-01-18: qty 1

## 2020-01-18 NOTE — Discharge Instructions (Addendum)
Thank you for allowing [place] to care for you today. You are able to get your eyebrow wet where we applied dermabond. Please purchase over the counter eye lubricating drops. Please call opthalmology for a follow up appointment.   Please return to your closest emergency room if your symptoms worsens or new symptoms arise such as sudden severe headache, loss of consciousness, numbness or weakness.
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
## 2020-01-18 NOTE — Progress Notes (Signed)
Orthopedic Tech Progress Note Patient Details:  Matthew English 14-Jan-1986 [id number]  Ortho Devices Ortho Device/Splint Location: wrist forearm Ortho Device/Splint Interventions: Ordered, Application   Post Interventions Patient Tolerated: Well Instructions Provided: Care of device   Jennye Moccasin 01/18/2020, 11:38 AM

## 2020-05-30 DIAGNOSIS — R933 Abnormal findings on diagnostic imaging of other parts of digestive tract: Secondary | ICD-10-CM | POA: Diagnosis not present

## 2020-05-30 DIAGNOSIS — R1032 Left lower quadrant pain: Secondary | ICD-10-CM | POA: Diagnosis not present

## 2020-05-30 DIAGNOSIS — K5733 Diverticulitis of large intestine without perforation or abscess with bleeding: Secondary | ICD-10-CM | POA: Diagnosis not present

## 2020-06-28 ENCOUNTER — Encounter (HOSPITAL_COMMUNITY): Payer: Self-pay | Admitting: Emergency Medicine

## 2020-06-28 ENCOUNTER — Ambulatory Visit (HOSPITAL_COMMUNITY)
Admission: EM | Admit: 2020-06-28 | Discharge: 2020-06-28 | Disposition: A | Discharge status: Home / Self Care | Payer: Federal, State, Local not specified - PPO | Attending: Family Medicine | Admitting: Family Medicine

## 2020-06-28 DIAGNOSIS — Z20822 Contact with and (suspected) exposure to covid-19: Secondary | ICD-10-CM | POA: Diagnosis not present

## 2020-06-28 DIAGNOSIS — R058 Other specified cough: Secondary | ICD-10-CM

## 2020-06-28 NOTE — ED Triage Notes (Signed)
Pt presents with sneezing and cough xs 3 days. States wife and kids are COVID positive.

## 2020-06-28 NOTE — Discharge Instructions (Signed)
Given your recent exposure to Covid positive persons and your symptoms you most likely have Covid.  We will test you and call you with results in the next 24 to 48 hours.  Treatment is largely symptomatic.  Tylenol and ibuprofen are used for pain and discomfort.  You can use over-the-counter cough medications for your cough.  Try to stay hydrated.  If you develop any difficulty breathing please seek medical attention to get reevaluated.  You should isolate for 5 days after you test positive.  After 5 days if you are not improved symptomatically, you can go out as long as you wear a mask for least another 5 days.
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
## 2020-06-29 LAB — SARS CORONAVIRUS 2 (TAT 6-24 HRS): SARS Coronavirus 2: NEGATIVE

## 2020-07-07 DIAGNOSIS — K5730 Diverticulosis of large intestine without perforation or abscess without bleeding: Secondary | ICD-10-CM | POA: Diagnosis not present

## 2020-07-07 DIAGNOSIS — Z1329 Encounter for screening for other suspected endocrine disorder: Secondary | ICD-10-CM | POA: Diagnosis not present

## 2020-07-07 DIAGNOSIS — Z136 Encounter for screening for cardiovascular disorders: Secondary | ICD-10-CM | POA: Diagnosis not present

## 2020-07-07 DIAGNOSIS — Z0001 Encounter for general adult medical examination with abnormal findings: Secondary | ICD-10-CM | POA: Diagnosis not present

## 2020-07-07 DIAGNOSIS — Z131 Encounter for screening for diabetes mellitus: Secondary | ICD-10-CM | POA: Diagnosis not present

## 2021-05-25 DIAGNOSIS — R059 Cough, unspecified: Secondary | ICD-10-CM | POA: Diagnosis not present

## 2021-05-25 DIAGNOSIS — R1032 Left lower quadrant pain: Secondary | ICD-10-CM | POA: Diagnosis not present

## 2021-05-25 DIAGNOSIS — Z20822 Contact with and (suspected) exposure to covid-19: Secondary | ICD-10-CM | POA: Diagnosis not present

## 2021-09-23 ENCOUNTER — Inpatient Hospital Stay (HOSPITAL_COMMUNITY)
Admission: EM | Admit: 2021-09-23 | Discharge: 2021-09-27 | DRG: 392 | Disposition: A | Discharge status: Home / Self Care | Payer: Federal, State, Local not specified - PPO | Attending: Internal Medicine | Admitting: Internal Medicine

## 2021-09-23 ENCOUNTER — Encounter (HOSPITAL_COMMUNITY): Payer: Self-pay

## 2021-09-23 ENCOUNTER — Emergency Department (HOSPITAL_COMMUNITY): Payer: Federal, State, Local not specified - PPO

## 2021-09-23 ENCOUNTER — Other Ambulatory Visit: Payer: Self-pay

## 2021-09-23 DIAGNOSIS — K5792 Diverticulitis of intestine, part unspecified, without perforation or abscess without bleeding: Secondary | ICD-10-CM | POA: Diagnosis not present

## 2021-09-23 DIAGNOSIS — K5732 Diverticulitis of large intestine without perforation or abscess without bleeding: Secondary | ICD-10-CM | POA: Diagnosis not present

## 2021-09-23 DIAGNOSIS — K5720 Diverticulitis of large intestine with perforation and abscess without bleeding: Secondary | ICD-10-CM | POA: Diagnosis not present

## 2021-09-23 DIAGNOSIS — Z6830 Body mass index (BMI) 30.0-30.9, adult: Secondary | ICD-10-CM | POA: Diagnosis not present

## 2021-09-23 DIAGNOSIS — R1030 Lower abdominal pain, unspecified: Secondary | ICD-10-CM

## 2021-09-23 DIAGNOSIS — R197 Diarrhea, unspecified: Secondary | ICD-10-CM | POA: Diagnosis not present

## 2021-09-23 DIAGNOSIS — D72819 Decreased white blood cell count, unspecified: Secondary | ICD-10-CM | POA: Diagnosis not present

## 2021-09-23 DIAGNOSIS — E860 Dehydration: Secondary | ICD-10-CM | POA: Diagnosis not present

## 2021-09-23 DIAGNOSIS — Z79899 Other long term (current) drug therapy: Secondary | ICD-10-CM

## 2021-09-23 DIAGNOSIS — E669 Obesity, unspecified: Secondary | ICD-10-CM | POA: Diagnosis not present

## 2021-09-23 LAB — COMPREHENSIVE METABOLIC PANEL
ALT: 26 U/L (ref 0–44)
AST: 22 U/L (ref 15–41)
Albumin: 4.3 g/dL (ref 3.5–5.0)
Alkaline Phosphatase: 52 U/L (ref 38–126)
Anion gap: 10 (ref 5–15)
BUN: 17 mg/dL (ref 6–20)
CO2: 20 mmol/L — ABNORMAL LOW (ref 22–32)
Calcium: 8.7 mg/dL — ABNORMAL LOW (ref 8.9–10.3)
Chloride: 104 mmol/L (ref 98–111)
Creatinine, Ser: 0.97 mg/dL (ref 0.61–1.24)
GFR, Estimated: >60 mL/min (ref >60)
Glucose, Bld: 96 mg/dL (ref 70–99)
Potassium: 3.8 mmol/L (ref 3.5–5.1)
Sodium: 134 mmol/L — ABNORMAL LOW (ref 135–145)
Total Bilirubin: 0.7 mg/dL (ref 0.3–1.2)
Total Protein: 7.3 g/dL (ref 6.5–8.1)

## 2021-09-23 LAB — URINALYSIS, ROUTINE W REFLEX MICROSCOPIC
Bilirubin Urine: NEGATIVE
Glucose, UA: NEGATIVE mg/dL
Ketones, ur: 20 mg/dL — AB
Nitrite: NEGATIVE
Protein, ur: 100 mg/dL — AB
Specific Gravity, Urine: 1.032 — ABNORMAL HIGH (ref 1.005–1.030)
pH: 5 (ref 5.0–8.0)

## 2021-09-23 LAB — CBC WITH DIFFERENTIAL/PLATELET
Abs Immature Granulocytes: 0.01 10*3/uL (ref 0.00–0.07)
Basophils Absolute: 0 10*3/uL (ref 0.0–0.1)
Basophils Relative: 0 %
Eosinophils Absolute: 0 10*3/uL (ref 0.0–0.5)
Eosinophils Relative: 0 %
HCT: 43.8 % (ref 39.0–52.0)
Hemoglobin: 15.4 g/dL (ref 13.0–17.0)
Immature Granulocytes: 0 %
Lymphocytes Relative: 29 %
Lymphs Abs: 1.1 10*3/uL (ref 0.7–4.0)
MCH: 30 pg (ref 26.0–34.0)
MCHC: 35.2 g/dL (ref 30.0–36.0)
MCV: 85.2 fL (ref 80.0–100.0)
Monocytes Absolute: 0.3 10*3/uL (ref 0.1–1.0)
Monocytes Relative: 8 %
Neutro Abs: 2.4 10*3/uL (ref 1.7–7.7)
Neutrophils Relative %: 63 %
Platelets: 176 10*3/uL (ref 150–400)
RBC: 5.14 MIL/uL (ref 4.22–5.81)
RDW: 12 % (ref 11.5–15.5)
WBC: 3.9 10*3/uL — ABNORMAL LOW (ref 4.0–10.5)
nRBC: 0 % (ref 0.0–0.2)

## 2021-09-23 LAB — LIPASE, BLOOD: Lipase: 37 U/L (ref 11–51)

## 2021-09-23 MED ORDER — IOHEXOL 300 MG/ML  SOLN
100.0000 mL | Freq: Once | INTRAMUSCULAR | Status: AC | PRN
  Administered 2021-09-23: 100 mL via INTRAVENOUS

## 2021-09-23 MED ORDER — ONDANSETRON HCL 4 MG PO TABS: 4.0000 mg | ORAL_TABLET | Freq: Four times a day (QID) | ORAL | Status: DC | PRN

## 2021-09-23 MED ORDER — LACTATED RINGERS IV SOLN: INTRAVENOUS | Status: DC

## 2021-09-23 MED ORDER — ACETAMINOPHEN 650 MG RE SUPP: 650.0000 mg | Freq: Four times a day (QID) | RECTAL | Status: DC | PRN

## 2021-09-23 MED ORDER — MORPHINE SULFATE (PF) 2 MG/ML IV SOLN: 2.0000 mg | INTRAVENOUS | Status: DC | PRN

## 2021-09-23 MED ORDER — PIPERACILLIN-TAZOBACTAM 3.375 G IVPB 30 MIN
3.3750 g | Freq: Once | INTRAVENOUS | Status: AC
  Administered 2021-09-23: 3.375 g via INTRAVENOUS
  Filled 2021-09-23: qty 50

## 2021-09-23 MED ORDER — ACETAMINOPHEN 325 MG PO TABS
650.0000 mg | ORAL_TABLET | Freq: Four times a day (QID) | ORAL | Status: DC | PRN
  Administered 2021-09-23 – 2021-09-24 (×2): 650 mg via ORAL
  Filled 2021-09-23 (×2): qty 2

## 2021-09-23 MED ORDER — ONDANSETRON HCL 4 MG/2ML IJ SOLN: 4.0000 mg | Freq: Four times a day (QID) | INTRAMUSCULAR | Status: DC | PRN

## 2021-09-23 MED ORDER — ENOXAPARIN SODIUM 40 MG/0.4ML IJ SOSY
40.0000 mg | PREFILLED_SYRINGE | INTRAMUSCULAR | Status: DC
  Administered 2021-09-24 – 2021-09-27 (×4): 40 mg via SUBCUTANEOUS
  Filled 2021-09-23 (×4): qty 0.4

## 2021-09-23 MED ORDER — SODIUM CHLORIDE 0.9 % IV BOLUS
1000.0000 mL | Freq: Once | INTRAVENOUS | Status: AC
  Administered 2021-09-23: 1000 mL via INTRAVENOUS

## 2021-09-23 NOTE — ED Provider Notes (Signed)
?[facility]-EMERGENCY DEPT ?Provider Note ? ? ?CSN: [id number] ?Arrival date & time: 09/23/21  1908 ? ?  ? ?History ? ?Chief Complaint  ?Patient presents with  ? Abdominal Pain  ? ? ?Matthew English is a 36 y.o. male. ? ?HPI ?He presents for evaluation of recurrent symptoms similar to a prior episode of diverticulitis, that was treated medically.  He is having moderately severe intermittent mid lower abdominal pain associated with frequent episodes of brown-colored diarrhea over the last 7 days.  He tried going on a clear liquid diet without change in this symptom complex.  He denies vomiting, fever, chills, weakness or dizziness. ?  ? ?Home Medications ?Prior to Admission medications   ?Medication Sig Start Date End Date Taking? Authorizing Provider  ?cetirizine (ZYRTEC) 10 MG tablet Take 1 tablet (10 mg total) by mouth daily. 01/05/20   Dahlia Byes A, NP  ?fluticasone (FLONASE) 50 MCG/ACT nasal spray Place 1 spray into both nostrils daily. 01/05/20   Janace Aris, NP  ?   ? ?Allergies    ?Patient has no known allergies.   ? ?Review of Systems   ?Review of Systems ? ?Physical Exam ?Updated Vital Signs ?BP (!) 142/101   Pulse 93   Temp 99.1 ?F (37.3 ?C) (Oral)   Resp 16   Ht 6\' 2"  (1.88 m)   Wt 108.9 kg   SpO2 98%   BMI 30.81 kg/m?  ?Physical Exam ?Vitals and nursing note reviewed.  ?Constitutional:   ?   Appearance: He is well-developed. He is not ill-appearing.  ?HENT:  ?   Head: Normocephalic and atraumatic.  ?   Right Ear: External ear normal.  ?   Left Ear: External ear normal.  ?Eyes:  ?   Conjunctiva/sclera: Conjunctivae normal.  ?   Pupils: Pupils are equal, round, and reactive to light.  ?Neck:  ?   Trachea: Phonation normal.  ?Cardiovascular:  ?   Rate and Rhythm: Normal rate.  ?Pulmonary:  ?   Effort: Pulmonary effort is normal.  ?Abdominal:  ?   General: There is no distension.  ?   Palpations: Abdomen is soft.  ?   Tenderness: There is no abdominal tenderness.  ?Musculoskeletal:      ?   General: Normal range of motion.  ?   Cervical back: Normal range of motion and neck supple.  ?Skin: ?   General: Skin is warm and dry.  ?Neurological:  ?   Mental Status: He is alert and oriented to person, place, and time.  ?   Cranial Nerves: No cranial nerve deficit.  ?   Sensory: No sensory deficit.  ?   Motor: No abnormal muscle tone.  ?   Coordination: Coordination normal.  ?Psychiatric:     ?   Mood and Affect: Mood normal.     ?   Behavior: Behavior normal.     ?   Thought Content: Thought content normal.     ?   Judgment: Judgment normal.  ? ? ?ED Results / Procedures / Treatments   ?Labs ?(all labs ordered are listed, but only abnormal results are displayed) ?Labs Reviewed  ?COMPREHENSIVE METABOLIC PANEL - Abnormal; Notable for the following components:  ?    Result Value  ? Sodium 134 (*)   ? CO2 20 (*)   ? Calcium 8.7 (*)   ? All other components within normal limits  ?URINALYSIS, ROUTINE W REFLEX MICROSCOPIC - Abnormal; Notable for the following components:  ? Specific  Gravity, Urine 1.032 (*)   ? Hgb urine dipstick SMALL (*)   ? Ketones, ur 20 (*)   ? Protein, ur 100 (*)   ? Leukocytes,Ua SMALL (*)   ? Bacteria, UA RARE (*)   ? All other components within normal limits  ?CBC WITH DIFFERENTIAL/PLATELET - Abnormal; Notable for the following components:  ? WBC 3.9 (*)   ? All other components within normal limits  ?LIPASE, BLOOD  ? ? ?EKG ?None ? ?Radiology ?CT Abdomen Pelvis W Contrast ? ?Addendum Date: 09/23/2021   ?ADDENDUM REPORT: 09/23/2021 21:32 ADDENDUM: Critical findings were reported to Dr. Effie ShyWentz at 9:31 p.m. Electronically Signed   By: Thornell SartoriusLaura  Taylor M.D.   On: 09/23/2021 21:32  ? ?Result Date: 09/23/2021 ?CLINICAL DATA:  Lower abdominal pain for 5 days with diarrhea. History of diverticulitis. EXAM: CT ABDOMEN AND PELVIS WITH CONTRAST TECHNIQUE: Multidetector CT imaging of the abdomen and pelvis was performed using the standard protocol following bolus administration of intravenous contrast.  RADIATION DOSE REDUCTION: This exam was performed according to the departmental dose-optimization program which includes automated exposure control, adjustment of the mA and/or kV according to patient size and/or use of iterative reconstruction technique. CONTRAST:  100mL OMNIPAQUE IOHEXOL 300 MG/ML  SOLN COMPARISON:  01/22/2019. FINDINGS: Lower chest: No acute abnormality. Hepatobiliary: No focal liver abnormality is seen. No gallstones, gallbladder wall thickening, or biliary dilatation. Pancreas: Unremarkable. No pancreatic ductal dilatation or surrounding inflammatory changes. Spleen: Normal in size without focal abnormality. Adrenals/Urinary Tract: Adrenal glands are unremarkable. Kidneys are normal, without renal calculi, focal lesion, or hydronephrosis. Bladder is unremarkable. Stomach/Bowel: The stomach is within normal limits. Multiple scattered diverticula are present along the colon. There is mild bowel wall thickening with surrounding fat stranding involving the colon at the hepatic flexure, compatible with diverticulitis. A few tiny locules of air are seen in the mesentery in this region, concerning for microperforation. No abscess is identified. No pneumatosis. A normal appendix is seen in the right lower quadrant. Vascular/Lymphatic: No significant vascular findings are present. No enlarged abdominal or pelvic lymph nodes. Reproductive: Prostate is unremarkable. Other: Small fat containing inguinal hernias are noted bilaterally. There is a small fat containing umbilical hernia. A trace amount of free fluid is noted in the anterior pararenal space on the right. Musculoskeletal: No acute or suspicious osseous abnormality. IMPRESSION: Acute diverticulitis involving the colon at the hepatic flexure with a few foci of free air in the pericolonic soft tissues suggesting microperforation. No abscess is identified. Electronically Signed: By: Thornell SartoriusLaura  Taylor M.D. On: 09/23/2021 21:29   ? ?Procedures ?Procedures   ? ? ?Medications Ordered in ED ?Medications  ?piperacillin-tazobactam (ZOSYN) IVPB 3.375 g (has no administration in time range)  ?sodium chloride 0.9 % bolus 1,000 mL (1,000 mLs Intravenous New Bag/Given 09/23/21 2040)  ?iohexol (OMNIPAQUE) 300 MG/ML solution 100 mL (100 mLs Intravenous Contrast Given 09/23/21 2107)  ? ? ?ED Course/ Medical Decision Making/ A&P ?  ?                        ?Medical Decision Making ?Patient presenting with abdominal pain and diarrhea for 1 week.  He has a history of diverticulitis, but did not follow-up with general surgery or gastroenterology after that episode, 2 years ago.  At the time he had had diverticulitis with local perforation.  He did not require surgical intervention and was not chronically ill afterwards.  No known sick contacts or suspected abnormal food ingestions. ? ?Problems  Addressed: ?Dehydration: acute illness or injury ?   Details: Elevated urine specific gravity ?Diarrhea, unspecified type: acute illness or injury ?   Details: Manson Passey in color without bleeding ?Diverticulitis of colon with perforation: acute illness or injury ?   Details: Recurrent process, previously evaluated by general surgery. ?Lower abdominal pain: acute illness or injury ?   Details: Recurrent symptoms. ? ?Amount and/or Complexity of Data Reviewed ?Independent Historian:  ?   Details: He is a cogent historian ?External Data Reviewed: notes. ?   Details: Prior hospitalization with diverticulitis, and secondary local perforation.  No ongoing problems, documented in the EMR. ?Labs: ordered. ?   Details: CBC, metabolic panel, lipase, urinalysis-normal except elevated urine specific gravity. ?Radiology: ordered and independent interpretation performed. ?   Details: CT abdomen pelvis-hepatic flexure diverticulitis with microperforation. ?Discussion of management or test interpretation with external provider(s): Case discussed with general surgery, Dr. Doylene Canard who will see patient as consulted and  request that he be kept n.p.o., and started on Zosyn.  She will see the patient later tonight or tomorrow morning. ? ?Consult hospitalist to arrange for admission and management ? ?Risk ?Prescription drug mana

## 2021-09-23 NOTE — ED Triage Notes (Signed)
Lower abdominal pain x 5 days with diarrhea. Denies nausea or vomiting. Says he has a hx of diverticulitis. Attempted bowel rest and drank fluids but had no improvement.  ? ?Also sts a cold sweat and chills at night when he goes to bed.  ?

## 2021-09-23 NOTE — H&P (Signed)
?History and Physical  ? ? ?Patient: Matthew English IRC:[id number] DOB: August 15, 1985 ?DOA: 09/23/2021 ?DOS: the patient was seen and examined on 09/23/2021 ?PCP: Patient, No Pcp Per (Inactive)  ?Patient coming from: Home ? ?Chief Complaint:  ?Chief Complaint  ?Patient presents with  ? Abdominal Pain  ? ?HPI: Matthew English is a 36 y.o. male with medical history significant of Diverticulitis in 2018 and 2020. ? ?Pt presents to ED with 5 day h/o lower abd pain and diarrhea. ? ?No N/V. ? ?Attempted bowel rest and drank fluids at home but pt persisted. ? ?Has chills at home. ? ?  ?Review of Systems: As mentioned in the history of present illness. All other systems reviewed and are negative. ?Past Medical History:  ?Diagnosis Date  ? Diverticulitis   ? ?Past Surgical History:  ?Procedure Laterality Date  ? NO PAST SURGERIES    ? ?Social History:  reports that he has never smoked. He has never used smokeless tobacco. He reports current alcohol use. He reports that he does not use drugs. ? ?No Known Allergies ? ?No family history on file. ? ?Prior to Admission medications   ?Medication Sig Start Date End Date Taking? Authorizing Provider  ?cetirizine (ZYRTEC) 10 MG tablet Take 1 tablet (10 mg total) by mouth daily. 01/05/20   Dahlia Byes A, NP  ?fluticasone (FLONASE) 50 MCG/ACT nasal spray Place 1 spray into both nostrils daily. 01/05/20   Janace Aris, NP  ? ? ?Physical Exam: ?Vitals:  ? 09/23/21 1918 09/23/21 1945 09/23/21 2115 09/23/21 2130  ?BP:  (!) 142/87 (!) 142/101 (!) 154/101  ?Pulse:  95 93 87  ?Resp:   16 18  ?Temp:      ?TempSrc:      ?SpO2:  100% 98% 100%  ?Weight: 108.9 kg     ?Height: 6\' 2"  (1.88 m)     ? ?Constitutional: NAD, calm, comfortable ?Eyes: PERRL, lids and conjunctivae normal ?ENMT: Mucous membranes are moist. Posterior pharynx clear of any exudate or lesions.Normal dentition.  ?Neck: normal, supple, no masses, no thyromegaly ?Respiratory: clear to auscultation bilaterally, no wheezing, no crackles.  Normal respiratory effort. No accessory muscle use.  ?Cardiovascular: Regular rate and rhythm, no murmurs / rubs / gallops. No extremity edema. 2+ pedal pulses. No carotid bruits.  ?Abdomen: TTP, no guarding nor rebound ?Musculoskeletal: no clubbing / cyanosis. No joint deformity upper and lower extremities. Good ROM, no contractures. Normal muscle tone.  ?Skin: no rashes, lesions, ulcers. No induration ?Neurologic: CN 2-12 grossly intact. Sensation intact, DTR normal. Strength 5/5 in all 4.  ?Psychiatric: Normal judgment and insight. Alert and oriented x 3. Normal mood.  ? ?Data Reviewed: ? ?Acute diverticulitis with microperf of hepatic flexure. ? ?Assessment and Plan: ?* Acute diverticulitis ?Hepatic flexure of colon, with microperf, no abscess. ?History of prior diverticulitis episodes in 2018 and 2020 despite young age.  ?Empiric zosyn ?NPO ?IVF ?zofran PRN nausea ?Morphine PRN pain ?EDP spoke with gen surg. ?Certainly doesn't look surgical at this point from acute episode ?Am concerned about long term though: 3rd episode of diverticulitis and patient is only 36 years old... ? ? ? ? ? Advance Care Planning:   Code Status: Full Code  ? ?Consults: Gen surg - sent message to Dr. 32; clearly wont be surgical this time unless diverticulitis gets significantly worse / complicated, but I am wondering about future prognosis / elective surgery as outpt?  Etc. ? ?Family Communication: No family in room ? ?Severity of Illness: ?The appropriate patient  status for this patient is OBSERVATION. Observation status is judged to be reasonable and necessary in order to provide the required intensity of service to ensure the patient's safety. The patient's presenting symptoms, physical exam findings, and initial radiographic and laboratory data in the context of their medical condition is felt to place them at decreased risk for further clinical deterioration. Furthermore, it is anticipated that the patient will be medically  stable for discharge from the hospital within 2 midnights of admission.  ? ?Author: ?Hillary Bow., DO ?09/23/2021 10:19 PM ? ?For on call review [email or web address].  ?

## 2021-09-23 NOTE — Assessment & Plan Note (Addendum)
Presents with lower abdominal pain and diarrhea.  CT confirms acute diverticulitis with microperforation at hepatic flexure.  Had episodes of diverticulitis in 2018 and 2020.  General surgery was consulted and followed during hospital course.  Patient was initially started on IV Zosyn and transition to Unasyn.  Patient's diet was slowly advanced with toleration with resolution of abdominal pain.  We will continue Augmentin 875-125 mg p.o. twice daily to complete 14-day course on discharge.  Ambulatory referral placed to gastroenterology for colonoscopy in 6-8 weeks.  Will need close follow-up with general surgery following colonoscopy. ?

## 2021-09-24 DIAGNOSIS — Z6830 Body mass index (BMI) 30.0-30.9, adult: Secondary | ICD-10-CM | POA: Diagnosis not present

## 2021-09-24 DIAGNOSIS — E860 Dehydration: Secondary | ICD-10-CM | POA: Diagnosis present

## 2021-09-24 DIAGNOSIS — E669 Obesity, unspecified: Secondary | ICD-10-CM | POA: Diagnosis present

## 2021-09-24 DIAGNOSIS — D72819 Decreased white blood cell count, unspecified: Secondary | ICD-10-CM | POA: Diagnosis not present

## 2021-09-24 DIAGNOSIS — K5792 Diverticulitis of intestine, part unspecified, without perforation or abscess without bleeding: Secondary | ICD-10-CM | POA: Diagnosis not present

## 2021-09-24 DIAGNOSIS — Z79899 Other long term (current) drug therapy: Secondary | ICD-10-CM | POA: Diagnosis not present

## 2021-09-24 DIAGNOSIS — K5720 Diverticulitis of large intestine with perforation and abscess without bleeding: Secondary | ICD-10-CM | POA: Diagnosis not present

## 2021-09-24 LAB — BASIC METABOLIC PANEL
Anion gap: 7 (ref 5–15)
BUN: 14 mg/dL (ref 6–20)
CO2: 25 mmol/L (ref 22–32)
Calcium: 8.4 mg/dL — ABNORMAL LOW (ref 8.9–10.3)
Chloride: 104 mmol/L (ref 98–111)
Creatinine, Ser: 1.08 mg/dL (ref 0.61–1.24)
GFR, Estimated: >60 mL/min (ref >60)
Glucose, Bld: 90 mg/dL (ref 70–99)
Potassium: 3.9 mmol/L (ref 3.5–5.1)
Sodium: 136 mmol/L (ref 135–145)

## 2021-09-24 LAB — CBC
HCT: 41.1 % (ref 39.0–52.0)
Hemoglobin: 13.8 g/dL (ref 13.0–17.0)
MCH: 29.8 pg (ref 26.0–34.0)
MCHC: 33.6 g/dL (ref 30.0–36.0)
MCV: 88.8 fL (ref 80.0–100.0)
Platelets: 157 10*3/uL (ref 150–400)
RBC: 4.63 MIL/uL (ref 4.22–5.81)
RDW: 12.1 % (ref 11.5–15.5)
WBC: 4 10*3/uL (ref 4.0–10.5)
nRBC: 0 % (ref 0.0–0.2)

## 2021-09-24 LAB — HIV ANTIBODY (ROUTINE TESTING W REFLEX): HIV Screen 4th Generation wRfx: NONREACTIVE

## 2021-09-24 MED ORDER — PIPERACILLIN-TAZOBACTAM 3.375 G IVPB
3.3750 g | Freq: Three times a day (TID) | INTRAVENOUS | Status: DC
Start: 2021-09-24 — End: 2021-09-25
  Administered 2021-09-24 – 2021-09-25 (×4): 3.375 g via INTRAVENOUS
  Filled 2021-09-24 (×4): qty 50

## 2021-09-24 NOTE — Hospital Course (Addendum)
Matthew English is a 36 year old male with past medical history significant for diverticulitis in 2018 and 2020 presenting with lower abdominal pain and diarrhea and found to have recurrent acute diverticulitis.  CT abdomen and pelvis confirmed acute diverticulitis with microperforation at hepatic flexure.  No abscess identified.  Started on IV Zosyn.  General surgery consulted.  Hospital service consulted for further evaluation management. ?

## 2021-09-24 NOTE — Assessment & Plan Note (Addendum)
Body mass index is 30.81 kg/m?.  Etiology likely secondary to muscular mass given his body habitus. ?

## 2021-09-24 NOTE — Plan of Care (Signed)
  Problem: Education: Goal: Knowledge of General Education information will improve Description Including pain rating scale, medication(s)/side effects and non-pharmacologic comfort measures Outcome: Progressing   Problem: Nutrition: Goal: Adequate nutrition will be maintained Outcome: Progressing   Problem: Pain Managment: Goal: General experience of comfort will improve Outcome: Progressing   

## 2021-09-24 NOTE — Progress Notes (Signed)
?PROGRESS NOTE ? ?Matthew CradleGeorge English VFI:[id number] DOB: 05/27/1986  ? ?PCP: Matthew English, No Pcp Per (Inactive) ? ?Matthew English is from: Home. ? ?DOA: 09/23/2021 LOS: 0 ? ?Chief complaints ?Chief Complaint  ?Matthew English presents with  ? Abdominal Pain  ?  ? ?Brief Narrative / Interim history: ?36 year old M with PMH of diverticulitis in 2018 and 2020 presenting with lower abdominal pain and diarrhea and found to have diverticulitis.  CT abdomen and pelvis confirmed acute diverticulitis with microperforation at hepatic flexure.  No abscess identified.  Started on IV Zosyn.  General surgery consulted and following.  ? ?Subjective: ?Seen and examined earlier this morning.  No major events overnight of this morning.  Reports intermittent cramping abdominal pain across lower abdomen mainly on the right.  Rates his pain 10/10 at its worst.  Pain lasts about 2 to 3 minutes.  Has small bowel movements.  Denies melena or hematochezia. ? ?Objective: ?Vitals:  ? 09/23/21 2233 09/24/21 [id number] 09/24/21 0525 09/24/21 0916  ?BP: 127/71 (!) 153/110 126/89 140/83  ?Pulse: 91 80 63 71  ?Resp: 18 18 18 19   ?Temp: 98.7 ?F (37.1 ?C) 98.9 ?F (37.2 ?C) 98.8 ?F (37.1 ?C) 98.2 ?F (36.8 ?C)  ?TempSrc: Oral Oral Oral Oral  ?SpO2: 100% 97% 99% 95%  ?Weight:      ?Height:      ? ? ?Examination: ? ?GENERAL: No apparent distress.  Nontoxic. ?HEENT: MMM.  Vision and hearing grossly intact.  ?NECK: Supple.  No apparent JVD.  ?RESP:  No IWOB.  Fair aeration bilaterally. ?CVS:  RRR. Heart sounds normal.  ?ABD/GI/GU: BS+. Abd soft.  Mild tenderness over LLQ. ?MSK/EXT:  Moves extremities. No apparent deformity. No edema.  ?SKIN: no apparent skin lesion or wound ?NEURO: Awake, alert and oriented appropriately.  No apparent focal neuro deficit. ?PSYCH: Calm. Normal affect.  ? ?Procedures:  ?None ? ?Microbiology summarized: ?None ? ?Assessment and Plan: ?* Acute diverticulitis with microperforation ?Presents with lower abdominal pain and diarrhea.  CT confirms acute  diverticulitis with microperforation at hepatic flexure.  Still with intermittent cramping severe abdominal pain.  Had episodes of diverticulitis in 2018 and 2020.  General surgery following. ?-Continue IV Zosyn ?-Sips of liquids per general surgery. ?-IV morphine as needed for pain control ?-Continue IV fluid ?-Needs colonoscopy and follow-up with colorectal surgeon once diverticulitis resolves ? ?Obesity (BMI 30-39.9) ?Body mass index is 30.81 kg/m?Marland Kitchen. ?Could be muscular mass ?-Encourage lifestyle change to lose weight. ? ? ?DVT prophylaxis:  ?enoxaparin (LOVENOX) injection 40 mg Start: 09/24/21 1000 ?Place and maintain sequential compression device Start: 09/24/21 0645 ? ?Code Status: Full code ?Family Communication: Matthew English and/or RN. Available if any question.  ?Level of care: Med-Surg ?Status is: Observation ?The Matthew English will require care spanning > 2 midnights and should be moved to inpatient because: Acute diverticulitis with microperforation requiring IV antibiotics and IV pain medication for pain control ? ? ?Final disposition: Likely home once medically cleared ? ?Consultants:  ?General surgery ? ?Sch Meds:  ?Scheduled Meds: ? enoxaparin (LOVENOX) injection  40 mg Subcutaneous Q24H  ? ?Continuous Infusions: ? lactated ringers 125 mL/hr at 09/23/21 2316  ? piperacillin-tazobactam (ZOSYN)  IV 3.375 g (09/24/21 0824)  ? ?PRN Meds:.acetaminophen **OR** acetaminophen, morphine injection, ondansetron **OR** ondansetron (ZOFRAN) IV ? ?Antimicrobials: ?Anti-infectives (From admission, onward)  ? ? Start     Dose/Rate Route Frequency Ordered Stop  ? 09/24/21 0745  piperacillin-tazobactam (ZOSYN) IVPB 3.375 g       ? 3.375 g ?12.5 mL/hr over 240 Minutes Intravenous  Every 8 hours 09/24/21 0652    ? 09/23/21 2145  piperacillin-tazobactam (ZOSYN) IVPB 3.375 g       ? 3.375 g ?100 mL/hr over 30 Minutes Intravenous  Once 09/23/21 2139 09/23/21 2234  ? ?  ? ? ? ?I have personally reviewed the following labs and  images: ?CBC: ?Recent Labs  ?Lab 09/23/21 ?1946 09/24/21 ?0316  ?WBC 3.9* 4.0  ?NEUTROABS 2.4  --   ?HGB 15.4 13.8  ?HCT 43.8 41.1  ?MCV 85.2 88.8  ?PLT 176 157  ? ?BMP &GFR ?Recent Labs  ?Lab 09/23/21 ?1946 09/24/21 ?0316  ?NA 134* 136  ?K 3.8 3.9  ?CL 104 104  ?CO2 20* 25  ?GLUCOSE 96 90  ?BUN 17 14  ?CREATININE 0.97 1.08  ?CALCIUM 8.7* 8.4*  ? ?Estimated Creatinine Clearance: 127.8 mL/min (by C-G formula based on SCr of 1.08 mg/dL). ?Liver & Pancreas: ?Recent Labs  ?Lab 09/23/21 ?1946  ?AST 22  ?ALT 26  ?ALKPHOS 52  ?BILITOT 0.7  ?PROT 7.3  ?ALBUMIN 4.3  ? ?Recent Labs  ?Lab 09/23/21 ?1946  ?LIPASE 37  ? ?No results for input(s): AMMONIA in the last 168 hours. ?Diabetic: ?No results for input(s): HGBA1C in the last 72 hours. ?No results for input(s): GLUCAP in the last 168 hours. ?Cardiac Enzymes: ?No results for input(s): CKTOTAL, CKMB, CKMBINDEX, TROPONINI in the last 168 hours. ?No results for input(s): PROBNP in the last 8760 hours. ?Coagulation Profile: ?No results for input(s): INR, PROTIME in the last 168 hours. ?Thyroid Function Tests: ?No results for input(s): TSH, T4TOTAL, FREET4, T3FREE, THYROIDAB in the last 72 hours. ?Lipid Profile: ?No results for input(s): CHOL, HDL, LDLCALC, TRIG, CHOLHDL, LDLDIRECT in the last 72 hours. ?Anemia Panel: ?No results for input(s): VITAMINB12, FOLATE, FERRITIN, TIBC, IRON, RETICCTPCT in the last 72 hours. ?Urine analysis: ?   ?Component Value Date/Time  ? COLORURINE YELLOW 09/23/2021 1920  ? APPEARANCEUR CLEAR 09/23/2021 1920  ? LABSPEC 1.032 (H) 09/23/2021 1920  ? PHURINE 5.0 09/23/2021 1920  ? GLUCOSEU NEGATIVE 09/23/2021 1920  ? HGBUR SMALL (A) 09/23/2021 1920  ? BILIRUBINUR NEGATIVE 09/23/2021 1920  ? KETONESUR 20 (A) 09/23/2021 1920  ? PROTEINUR 100 (A) 09/23/2021 1920  ? NITRITE NEGATIVE 09/23/2021 1920  ? LEUKOCYTESUR SMALL (A) 09/23/2021 1920  ? ?Sepsis Labs: ?Invalid input(s): PROCALCITONIN, LACTICIDVEN ? ?Microbiology: ?No results found for this or any  previous visit (from the past 240 hour(s)). ? ?Radiology Studies: ?CT Abdomen Pelvis W Contrast ? ?Addendum Date: 09/23/2021   ?ADDENDUM REPORT: 09/23/2021 21:32 ADDENDUM: Critical findings were reported to Dr. Effie Shy at 9:31 p.m. Electronically Signed   By: Thornell Sartorius M.D.   On: 09/23/2021 21:32  ? ?Result Date: 09/23/2021 ?CLINICAL DATA:  Lower abdominal pain for 5 days with diarrhea. History of diverticulitis. EXAM: CT ABDOMEN AND PELVIS WITH CONTRAST TECHNIQUE: Multidetector CT imaging of the abdomen and pelvis was performed using the standard protocol following bolus administration of intravenous contrast. RADIATION DOSE REDUCTION: This exam was performed according to the departmental dose-optimization program which includes automated exposure control, adjustment of the mA and/or kV according to Matthew English size and/or use of iterative reconstruction technique. CONTRAST:  OMNIPAQUE IOHEXOL 300 MG/ML  SOLN COMPARISON:  01/22/2019. FINDINGS: Lower chest: No acute abnormality. Hepatobiliary: No focal liver abnormality is seen. No gallstones, gallbladder wall thickening, or biliary dilatation. Pancreas: Unremarkable. No pancreatic ductal dilatation or surrounding inflammatory changes. Spleen: Normal in size without focal abnormality. Adrenals/Urinary Tract: Adrenal glands are unremarkable. Kidneys are normal, without renal calculi,  focal lesion, or hydronephrosis. Bladder is unremarkable. Stomach/Bowel: The stomach is within normal limits. Multiple scattered diverticula are present along the colon. There is mild bowel wall thickening with surrounding fat stranding involving the colon at the hepatic flexure, compatible with diverticulitis. A few tiny locules of air are seen in the mesentery in this region, concerning for microperforation. No abscess is identified. No pneumatosis. A normal appendix is seen in the right lower quadrant. Vascular/Lymphatic: No significant vascular findings are present. No enlarged  abdominal or pelvic lymph nodes. Reproductive: Prostate is unremarkable. Other: Small fat containing inguinal hernias are noted bilaterally. There is a small fat containing umbilical hernia. A trace amount of free fluid is noted in

## 2021-09-24 NOTE — Plan of Care (Signed)
°  Problem: Coping: °Goal: Level of anxiety will decrease °Outcome: Progressing °  °

## 2021-09-24 NOTE — Progress Notes (Signed)
Pharmacy Antibiotic Note ? ?Matthew English is a 36 y.o. male admitted on 09/23/2021 with  abdominal pain: diverticulitis with microperforation .  Pharmacy has been consulted for piperacillin/tazobactam dosing. ? ? ?Plan: ?Piperacillin/tazobactam 3.375 g IV q8h EI ?Follow renal function ? ?Height: 6\' 2"  (188 cm) ?Weight: 108.9 kg (240 lb) ?IBW/kg (Calculated) : 82.2 ? ?Temp (24hrs), Avg:98.9 ?F (37.2 ?C), Min:98.7 ?F (37.1 ?C), Max:99.1 ?F (37.3 ?C) ? ?Recent Labs  ?Lab 09/23/21 ?1946 09/24/21 ?0316  ?WBC 3.9* 4.0  ?CREATININE 0.97 1.08  ?  ?Estimated Creatinine Clearance: 127.8 mL/min (by C-G formula based on SCr of 1.08 mg/dL).   ? ?No Known Allergies ? ?11/24/21, PharmD ?09/24/2021 6:52 AM ? ?
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
## 2021-09-25 DIAGNOSIS — K5792 Diverticulitis of intestine, part unspecified, without perforation or abscess without bleeding: Secondary | ICD-10-CM | POA: Diagnosis not present

## 2021-09-25 DIAGNOSIS — E669 Obesity, unspecified: Secondary | ICD-10-CM | POA: Diagnosis not present

## 2021-09-25 LAB — CBC
HCT: 41.1 % (ref 39.0–52.0)
Hemoglobin: 13.8 g/dL (ref 13.0–17.0)
MCH: 29.8 pg (ref 26.0–34.0)
MCHC: 33.6 g/dL (ref 30.0–36.0)
MCV: 88.8 fL (ref 80.0–100.0)
Platelets: 158 10*3/uL (ref 150–400)
RBC: 4.63 MIL/uL (ref 4.22–5.81)
RDW: 12.2 % (ref 11.5–15.5)
WBC: 3.6 10*3/uL — ABNORMAL LOW (ref 4.0–10.5)
nRBC: 0 % (ref 0.0–0.2)

## 2021-09-25 LAB — RENAL FUNCTION PANEL
Albumin: 3.6 g/dL (ref 3.5–5.0)
Anion gap: 7 (ref 5–15)
BUN: 16 mg/dL (ref 6–20)
CO2: 27 mmol/L (ref 22–32)
Calcium: 8.6 mg/dL — ABNORMAL LOW (ref 8.9–10.3)
Chloride: 104 mmol/L (ref 98–111)
Creatinine, Ser: 1 mg/dL (ref 0.61–1.24)
GFR, Estimated: >60 mL/min (ref >60)
Glucose, Bld: 90 mg/dL (ref 70–99)
Phosphorus: 4.7 mg/dL — ABNORMAL HIGH (ref 2.5–4.6)
Potassium: 4.2 mmol/L (ref 3.5–5.1)
Sodium: 138 mmol/L (ref 135–145)

## 2021-09-25 LAB — MAGNESIUM: Magnesium: 2.2 mg/dL (ref 1.7–2.4)

## 2021-09-25 MED ORDER — SODIUM CHLORIDE 0.9 % IV SOLN
3.0000 g | Freq: Four times a day (QID) | INTRAVENOUS | Status: DC
  Administered 2021-09-25 – 2021-09-27 (×8): 3 g via INTRAVENOUS
  Filled 2021-09-25 (×10): qty 8

## 2021-09-25 MED ORDER — SACCHAROMYCES BOULARDII 250 MG PO CAPS
250.0000 mg | ORAL_CAPSULE | Freq: Two times a day (BID) | ORAL | Status: DC
  Administered 2021-09-25 – 2021-09-27 (×5): 250 mg via ORAL
  Filled 2021-09-25 (×5): qty 1

## 2021-09-25 NOTE — Plan of Care (Signed)
?  Problem: Coping: ?Goal: Level of anxiety will decrease ?Outcome: Progressing ?  ?Problem: Elimination: ?Goal: Will not experience complications related to bowel motility ?Outcome: Progressing ?Goal: Will not experience complications related to urinary retention ?Outcome: Progressing ?  ?Problem: Elimination: ?Goal: Will not experience complications related to bowel motility ?Outcome: Progressing ?Goal: Will not experience complications related to urinary retention ?Outcome: Progressing ?  ?Problem: Pain Managment: ?Goal: General experience of comfort will improve ?Outcome: Progressing ?  ?

## 2021-09-25 NOTE — Progress Notes (Signed)
?PROGRESS NOTE ? ?Jonpaul Regehr JXB:[id number] DOB: 12-03-1985  ? ?PCP: Patient, No Pcp Per (Inactive) ? ?Patient is from: Home ? ?DOA: 09/23/2021 LOS: 1 ? ?Chief complaints ?Chief Complaint  ?Patient presents with  ? Abdominal Pain  ?  ? ?Brief Narrative / Interim history: ?36 year old M with PMH of diverticulitis in 2018 and 2020 presenting with lower abdominal pain and diarrhea and found to have diverticulitis.  CT abdomen and pelvis confirmed acute diverticulitis with microperforation at hepatic flexure.  No abscess identified.  Started on IV Zosyn.  General surgery consulted and following.  ? ?Subjective: ?Seen and examined earlier this morning.  No major events overnight of this morning.  Reports improvement in his pain.  He denies nausea or vomiting.  Having bowel movements.  He denies melena or hematochezia. ? ?Objective: ?Vitals:  ? 09/24/21 1509 09/24/21 2155 09/25/21 0843 09/25/21 1309  ?BP: 136/86 (!) 134/96 132/83 126/89  ?Pulse: 68 75 76 67  ?Resp: 15 16 17 15   ?Temp: 98.1 ?F (36.7 ?C) 98.4 ?F (36.9 ?C) 98.9 ?F (37.2 ?C) 98.4 ?F (36.9 ?C)  ?TempSrc: Oral Oral Oral Oral  ?SpO2: 97% 100% 100% 100%  ?Weight:      ?Height:      ? ? ?Examination: ? ?GENERAL: No apparent distress.  Nontoxic. ?HEENT: MMM.  Vision and hearing grossly intact.  ?NECK: Supple.  No apparent JVD.  ?RESP:  No IWOB.  Fair aeration bilaterally. ?CVS:  RRR. Heart sounds normal.  ?ABD/GI/GU: BS+. Abd soft.  Mild discomfort with deep palpation. ?MSK/EXT:  Moves extremities. No apparent deformity. No edema.  ?SKIN: no apparent skin lesion or wound ?NEURO: Awake, alert and oriented appropriately.  No apparent focal neuro deficit. ?PSYCH: Calm. Normal affect.  ? ?Procedures:  ?None ? ?Microbiology summarized: ?None ? ?Assessment and Plan: ?* Acute diverticulitis with microperforation ?Presents with lower abdominal pain and diarrhea.  CT confirms acute diverticulitis with microperforation at hepatic flexure.  Had episodes of diverticulitis in  2018 and 2020.  Improving.  General surgery following. ?-IV Zosyn>> IV Unasyn ?-Clear liquid diet today ?-IV morphine as needed for pain ?-Continue IV fluid ?-Needs colonoscopy and follow-up with colorectal surgeon once diverticulitis resolves ? ?Obesity (BMI 30-39.9) ?Body mass index is 30.81 kg/m?Marland Kitchen ?Could be muscular mass ?-Encourage lifestyle change to lose weight. ? ? ?  ?DVT prophylaxis:  ?enoxaparin (LOVENOX) injection 40 mg Start: 09/24/21 1000 ?Place and maintain sequential compression device Start: 09/24/21 0645 ? ?Code Status: Full code ?Family Communication: Patient and/or RN. Available if any question.  ?Level of care: Med-Surg ?Status is: Inpatient ?Remains inpatient appropriate because: Acute diverticulitis with microperforation requiring IV antibiotics ? ? ?Final disposition: Home. ? ?Consultants:  ?General surgery ? ?Sch Meds:  ?Scheduled Meds: ? enoxaparin (LOVENOX) injection  40 mg Subcutaneous Q24H  ? ?Continuous Infusions: ? ampicillin-sulbactam (UNASYN) IV 3 g (09/25/21 1312)  ? lactated ringers 125 mL/hr at 09/24/21 2225  ? ?PRN Meds:.acetaminophen **OR** acetaminophen, morphine injection, ondansetron **OR** ondansetron (ZOFRAN) IV ? ?Antimicrobials: ?Anti-infectives (From admission, onward)  ? ? Start     Dose/Rate Route Frequency Ordered Stop  ? 09/25/21 1400  Ampicillin-Sulbactam (UNASYN) 3 g in sodium chloride 0.9 % 100 mL IVPB       ? 3 g ?200 mL/hr over 30 Minutes Intravenous Every 6 hours 09/25/21 0905    ? 09/24/21 0745  piperacillin-tazobactam (ZOSYN) IVPB 3.375 g  Status:  Discontinued       ? 3.375 g ?12.5 mL/hr over 240 Minutes Intravenous Every 8 hours  09/24/21 0652 09/25/21 0905  ? 09/23/21 2145  piperacillin-tazobactam (ZOSYN) IVPB 3.375 g       ? 3.375 g ?100 mL/hr over 30 Minutes Intravenous  Once 09/23/21 2139 09/23/21 2234  ? ?  ? ? ? ?I have personally reviewed the following labs and images: ?CBC: ?Recent Labs  ?Lab 09/23/21 ?1946 09/24/21 ?0316 09/25/21 ?0300  ?WBC 3.9* 4.0  3.6*  ?NEUTROABS 2.4  --   --   ?HGB 15.4 13.8 13.8  ?HCT 43.8 41.1 41.1  ?MCV 85.2 88.8 88.8  ?PLT 176 157 158  ? ?BMP &GFR ?Recent Labs  ?Lab 09/23/21 ?1946 09/24/21 ?0316 09/25/21 ?0300  ?NA 134* 136 138  ?K 3.8 3.9 4.2  ?CL 104 104 104  ?CO2 20* 25 27  ?GLUCOSE 96 90 90  ?BUN 17 14 16   ?CREATININE 0.97 1.08 1.00  ?CALCIUM 8.7* 8.4* 8.6*  ?MG  --   --  2.2  ?PHOS  --   --  4.7*  ? ?Estimated Creatinine Clearance: 138.1 mL/min (by C-G formula based on SCr of 1 mg/dL). ?Liver & Pancreas: ?Recent Labs  ?Lab 09/23/21 ?1946 09/25/21 ?0300  ?AST 22  --   ?ALT 26  --   ?ALKPHOS 52  --   ?BILITOT 0.7  --   ?PROT 7.3  --   ?ALBUMIN 4.3 3.6  ? ?Recent Labs  ?Lab 09/23/21 ?1946  ?LIPASE 37  ? ?No results for input(s): AMMONIA in the last 168 hours. ?Diabetic: ?No results for input(s): HGBA1C in the last 72 hours. ?No results for input(s): GLUCAP in the last 168 hours. ?Cardiac Enzymes: ?No results for input(s): CKTOTAL, CKMB, CKMBINDEX, TROPONINI in the last 168 hours. ?No results for input(s): PROBNP in the last 8760 hours. ?Coagulation Profile: ?No results for input(s): INR, PROTIME in the last 168 hours. ?Thyroid Function Tests: ?No results for input(s): TSH, T4TOTAL, FREET4, T3FREE, THYROIDAB in the last 72 hours. ?Lipid Profile: ?No results for input(s): CHOL, HDL, LDLCALC, TRIG, CHOLHDL, LDLDIRECT in the last 72 hours. ?Anemia Panel: ?No results for input(s): VITAMINB12, FOLATE, FERRITIN, TIBC, IRON, RETICCTPCT in the last 72 hours. ?Urine analysis: ?   ?Component Value Date/Time  ? COLORURINE YELLOW 09/23/2021 1920  ? APPEARANCEUR CLEAR 09/23/2021 1920  ? LABSPEC 1.032 (H) 09/23/2021 1920  ? PHURINE 5.0 09/23/2021 1920  ? GLUCOSEU NEGATIVE 09/23/2021 1920  ? HGBUR SMALL (A) 09/23/2021 1920  ? BILIRUBINUR NEGATIVE 09/23/2021 1920  ? KETONESUR 20 (A) 09/23/2021 1920  ? PROTEINUR 100 (A) 09/23/2021 1920  ? NITRITE NEGATIVE 09/23/2021 1920  ? LEUKOCYTESUR SMALL (A) 09/23/2021 1920  ? ?Sepsis Labs: ?Invalid input(s):  PROCALCITONIN, LACTICIDVEN ? ?Microbiology: ?No results found for this or any previous visit (from the past 240 hour(s)). ? ?Radiology Studies: ?No results found. ? ? ? ?Mackenzy Grumbine T. Susy Placzek ?[facility] ? ?If 7PM-7AM, please contact night-coverage ?[email or web address] ?09/25/2021, 2:22 PM   ?

## 2021-09-25 NOTE — Progress Notes (Signed)
? ?Progress Note ? ?   ?Subjective: ?Pain continues to improve and has had BM. No nausea/emesis.  ? ?Objective: ?Vital signs in last 24 hours: ?Temp:  [98.1 ?F (36.7 ?C)-98.9 ?F (37.2 ?C)] 98.9 ?F (37.2 ?C) (04/03 3545) ?Pulse Rate:  [68-76] 76 (04/03 0843) ?Resp:  [15-19] 17 (04/03 0843) ?BP: (132-140)/(83-96) 132/83 (04/03 6256) ?SpO2:  [95 %-100 %] 100 % (04/03 0843) ?Last BM Date : 09/25/21 ? ?Intake/Output from previous day: ?04/02 0701 - 04/03 0700 ?In: 2010.1 [P.O.:75; I.V.:1785.1; IV Piggyback:150] ?Out: 250 [Urine:250] ?Intake/Output this shift: ?No intake/output data recorded. ? ?PE: ?General: pleasant, WD, male who is laying in bed in NAD ?HEENT: head is normocephalic, atraumatic.  Mouth is pink and moist ?Heart: Palpable radial pulses bilaterally ?Lungs: Respiratory effort nonlabored on room air ?Abd: soft, ND, +BS, mild TTP in LLQ without rebound or guarding ?MSK: all 4 extremities are symmetrical with no cyanosis, clubbing, or edema. ?Skin: warm and dry ?Psych: A&Ox3 with an appropriate affect.  ? ? ?Lab Results:  ?Recent Labs  ?  09/24/21 ?0316 09/25/21 ?0300  ?WBC 4.0 3.6*  ?HGB 13.8 13.8  ?HCT 41.1 41.1  ?PLT 157 158  ? ?BMET ?Recent Labs  ?  09/24/21 ?0316 09/25/21 ?0300  ?NA 136 138  ?K 3.9 4.2  ?CL 104 104  ?CO2 25 27  ?GLUCOSE 90 90  ?BUN 14 16  ?CREATININE 1.08 1.00  ?CALCIUM 8.4* 8.6*  ? ?PT/INR ?No results for input(s): LABPROT, INR in the last 72 hours. ?CMP  ?   ?Component Value Date/Time  ? NA 138 09/25/2021 0300  ? K 4.2 09/25/2021 0300  ? CL 104 09/25/2021 0300  ? CO2 27 09/25/2021 0300  ? GLUCOSE 90 09/25/2021 0300  ? BUN 16 09/25/2021 0300  ? CREATININE 1.00 09/25/2021 0300  ? CALCIUM 8.6 (L) 09/25/2021 0300  ? PROT 7.3 09/23/2021 1946  ? ALBUMIN 3.6 09/25/2021 0300  ? AST 22 09/23/2021 1946  ? ALT 26 09/23/2021 1946  ? ALKPHOS 52 09/23/2021 1946  ? BILITOT 0.7 09/23/2021 1946  ? GFRNONAA >60 09/25/2021 0300  ? GFRAA >60 01/26/2019 0449  ? ?Lipase  ?   ?Component Value Date/Time  ?  LIPASE 37 09/23/2021 1946  ? ? ? ? ? ?Studies/Results: ?CT Abdomen Pelvis W Contrast ? ?Addendum Date: 09/23/2021   ?ADDENDUM REPORT: 09/23/2021 21:32 ADDENDUM: Critical findings were reported to Dr. Effie Shy at 9:31 p.m. Electronically Signed   By: Thornell Sartorius M.D.   On: 09/23/2021 21:32  ? ?Result Date: 09/23/2021 ?CLINICAL DATA:  Lower abdominal pain for 5 days with diarrhea. History of diverticulitis. EXAM: CT ABDOMEN AND PELVIS WITH CONTRAST TECHNIQUE: Multidetector CT imaging of the abdomen and pelvis was performed using the standard protocol following bolus administration of intravenous contrast. RADIATION DOSE REDUCTION: This exam was performed according to the departmental dose-optimization program which includes automated exposure control, adjustment of the mA and/or kV according to patient size and/or use of iterative reconstruction technique. CONTRAST:  OMNIPAQUE IOHEXOL 300 MG/ML  SOLN COMPARISON:  01/22/2019. FINDINGS: Lower chest: No acute abnormality. Hepatobiliary: No focal liver abnormality is seen. No gallstones, gallbladder wall thickening, or biliary dilatation. Pancreas: Unremarkable. No pancreatic ductal dilatation or surrounding inflammatory changes. Spleen: Normal in size without focal abnormality. Adrenals/Urinary Tract: Adrenal glands are unremarkable. Kidneys are normal, without renal calculi, focal lesion, or hydronephrosis. Bladder is unremarkable. Stomach/Bowel: The stomach is within normal limits. Multiple scattered diverticula are present along the colon. There is mild bowel wall thickening with  surrounding fat stranding involving the colon at the hepatic flexure, compatible with diverticulitis. A few tiny locules of air are seen in the mesentery in this region, concerning for microperforation. No abscess is identified. No pneumatosis. A normal appendix is seen in the right lower quadrant. Vascular/Lymphatic: No significant vascular findings are present. No enlarged abdominal or  pelvic lymph nodes. Reproductive: Prostate is unremarkable. Other: Small fat containing inguinal hernias are noted bilaterally. There is a small fat containing umbilical hernia. A trace amount of free fluid is noted in the anterior pararenal space on the right. Musculoskeletal: No acute or suspicious osseous abnormality. IMPRESSION: Acute diverticulitis involving the colon at the hepatic flexure with a few foci of free air in the pericolonic soft tissues suggesting microperforation. No abscess is identified. Electronically Signed: By: Thornell Sartorius M.D. On: 09/23/2021 21:29   ? ?Anti-infectives: ?Anti-infectives (From admission, onward)  ? ? Start     Dose/Rate Route Frequency Ordered Stop  ? 09/24/21 0745  piperacillin-tazobactam (ZOSYN) IVPB 3.375 g       ? 3.375 g ?12.5 mL/hr over 240 Minutes Intravenous Every 8 hours 09/24/21 0652    ? 09/23/21 2145  piperacillin-tazobactam (ZOSYN) IVPB 3.375 g       ? 3.375 g ?100 mL/hr over 30 Minutes Intravenous  Once 09/23/21 2139 09/23/21 2234  ? ?  ? ? ? ?Assessment/Plan ?Diverticulitis with microperforation ?- CT 4/1 with diverticulitis and microperf at hepatic flexure ?- No current indication for emergency surgery ?- continue IV abx ?- pain improving. Advance to CLD today ?- this is his third episode. Hopeful for improvement with medical management. He will need colonoscopy and then follow up consultation with colorectal surgeon outpatient ?- Discussed if he was to fail to improve may obtain follow up CT scan to rule out abscess and if he was to fail to improve with conservative management or was to acutely worsen he may need surgery during admission that would likely result in a colectomy and colostomy.  ? ?FEN: CLD ?ID: unasyn 4/3>> ?VTE: lovenox ? ?I reviewed hospitalist notes, last 24 h vitals and pain scores, last 48 h intake and output, last 24 h labs and trends, and last 24 h imaging results. ? ? LOS: 1 day  ? ?Eric Form, PA-C ?[facility] ?09/25/2021, 8:58 AM ?Please see Amion for pager number during day hours 7:00am-4:30pm ? ?

## 2021-09-26 DIAGNOSIS — D72819 Decreased white blood cell count, unspecified: Secondary | ICD-10-CM

## 2021-09-26 DIAGNOSIS — K5792 Diverticulitis of intestine, part unspecified, without perforation or abscess without bleeding: Secondary | ICD-10-CM | POA: Diagnosis not present

## 2021-09-26 DIAGNOSIS — E669 Obesity, unspecified: Secondary | ICD-10-CM | POA: Diagnosis not present

## 2021-09-26 LAB — CBC
HCT: 38.5 % — ABNORMAL LOW (ref 39.0–52.0)
Hemoglobin: 13.1 g/dL (ref 13.0–17.0)
MCH: 29.7 pg (ref 26.0–34.0)
MCHC: 34 g/dL (ref 30.0–36.0)
MCV: 87.3 fL (ref 80.0–100.0)
Platelets: 142 10*3/uL — ABNORMAL LOW (ref 150–400)
RBC: 4.41 MIL/uL (ref 4.22–5.81)
RDW: 12 % (ref 11.5–15.5)
WBC: 3.2 10*3/uL — ABNORMAL LOW (ref 4.0–10.5)
nRBC: 0 % (ref 0.0–0.2)

## 2021-09-26 LAB — RENAL FUNCTION PANEL
Albumin: 3.4 g/dL — ABNORMAL LOW (ref 3.5–5.0)
Anion gap: 4 — ABNORMAL LOW (ref 5–15)
BUN: 7 mg/dL (ref 6–20)
CO2: 29 mmol/L (ref 22–32)
Calcium: 8.6 mg/dL — ABNORMAL LOW (ref 8.9–10.3)
Chloride: 108 mmol/L (ref 98–111)
Creatinine, Ser: 0.9 mg/dL (ref 0.61–1.24)
GFR, Estimated: >60 mL/min (ref >60)
Glucose, Bld: 98 mg/dL (ref 70–99)
Phosphorus: 3.7 mg/dL (ref 2.5–4.6)
Potassium: 4 mmol/L (ref 3.5–5.1)
Sodium: 141 mmol/L (ref 135–145)

## 2021-09-26 LAB — MAGNESIUM: Magnesium: 2 mg/dL (ref 1.7–2.4)

## 2021-09-26 MED ORDER — OXYCODONE HCL 5 MG PO TABS: 5.0000 mg | ORAL_TABLET | ORAL | Status: DC | PRN

## 2021-09-26 NOTE — Assessment & Plan Note (Addendum)
Likely from infection.  Continue antibiotics as above. ?

## 2021-09-26 NOTE — Progress Notes (Signed)
?PROGRESS NOTE ? ?Matthew English ZOX:[id number] DOB: 16-Mar-1986  ? ?PCP: Patient, No Pcp Per (Inactive) ? ?Patient is from: Home ? ?DOA: 09/23/2021 LOS: 2 ? ?Chief complaints ?Chief Complaint  ?Patient presents with  ? Abdominal Pain  ?  ? ?Brief Narrative / Interim history: ?36 year old M with PMH of diverticulitis in 2018 and 2020 presenting with lower abdominal pain and diarrhea and found to have diverticulitis.  CT abdomen and pelvis confirmed acute diverticulitis with microperforation at hepatic flexure.  No abscess identified.  Started on IV Zosyn.  General surgery consulted and following. ? ?Patient is slowly improving.  De-escalated to IV Unasyn.  Advance to full liquid diet.  General surgery following.  Could be cleared in the next 24 to 48 hours for discharge  ? ?Subjective: ?No major events overnight of this morning.  Still with some discomfort and pain but improved.  Has close bowel movements.  Denies melena or hematochezia.  Denies nausea or vomiting. ? ?Objective: ?Vitals:  ? 09/25/21 0843 09/25/21 1309 09/25/21 2112 09/26/21 0549  ?BP: 132/83 126/89 130/89 106/68  ?Pulse: 76 67 62 (!) 50  ?Resp: 17 15 18 18   ?Temp: 98.9 ?F (37.2 ?C) 98.4 ?F (36.9 ?C) 98.6 ?F (37 ?C) 97.9 ?F (36.6 ?C)  ?TempSrc: Oral Oral Oral Oral  ?SpO2: 100% 100% 100% 98%  ?Weight:      ?Height:      ? ? ?Examination: ? ?GENERAL: No apparent distress.  Nontoxic. ?HEENT: MMM.  Vision and hearing grossly intact.  ?NECK: Supple.  No apparent JVD.  ?RESP:  No IWOB.  Fair aeration bilaterally. ?CVS:  RRR. Heart sounds normal.  ?ABD/GI/GU: BS+. Abd soft.  Tenderness in LLQ.  No rebound. ?MSK/EXT:  Moves extremities. No apparent deformity. No edema.  ?SKIN: no apparent skin lesion or wound ?NEURO: Awake and alert. Oriented appropriately.  No apparent focal neuro deficit. ?PSYCH: Calm. Normal affect.  ? ?Procedures:  ?None ? ?Microbiology summarized: ?None ? ?Assessment and Plan: ?* Acute diverticulitis with microperforation ?Presents with  lower abdominal pain and diarrhea.  CT confirms acute diverticulitis with microperforation at hepatic flexure.  Had episodes of diverticulitis in 2018 and 2020.  Still with some abdominal pain and tenderness but improving.  General surgery following. ?-IV Zosyn>> IV Unasyn ?-Advance to full liquid diet.  Discontinue IV fluid. ?-IV morphine as needed for pain ?-Needs colonoscopy and follow-up with colorectal surgeon once diverticulitis resolves ? ?Leukopenia ?Likely from infection. ?Check CBC with differential in the morning ? ?Obesity (BMI 30-39.9) ?Body mass index is 30.81 kg/m?Marland Kitchen ?Could be muscular mass ?-Encourage lifestyle change to lose weight. ? ? ?  ?DVT prophylaxis:  ?enoxaparin (LOVENOX) injection 40 mg Start: 09/24/21 1000 ?Place and maintain sequential compression device Start: 09/24/21 0645 ? ?Code Status: Full code ?Family Communication: Patient and/or RN. Available if any question.  ?Level of care: Med-Surg ?Status is: Inpatient ?Remains inpatient appropriate because: Acute diverticulitis with microperforation requiring IV antibiotics ? ? ?Final disposition: Home once cleared by surgery. ? ?Consultants:  ?General surgery ? ?Sch Meds:  ?Scheduled Meds: ? enoxaparin (LOVENOX) injection  40 mg Subcutaneous Q24H  ? saccharomyces boulardii  250 mg Oral BID  ? ?Continuous Infusions: ? ampicillin-sulbactam (UNASYN) IV 3 g (09/26/21 0744)  ? ?PRN Meds:.acetaminophen **OR** acetaminophen, morphine injection, ondansetron **OR** ondansetron (ZOFRAN) IV ? ?Antimicrobials: ?Anti-infectives (From admission, onward)  ? ? Start     Dose/Rate Route Frequency Ordered Stop  ? 09/25/21 1400  Ampicillin-Sulbactam (UNASYN) 3 g in sodium chloride 0.9 % 100 mL  IVPB       ? 3 g ?200 mL/hr over 30 Minutes Intravenous Every 6 hours 09/25/21 0905    ? 09/24/21 0745  piperacillin-tazobactam (ZOSYN) IVPB 3.375 g  Status:  Discontinued       ? 3.375 g ?12.5 mL/hr over 240 Minutes Intravenous Every 8 hours 09/24/21 0652 09/25/21 0905   ? 09/23/21 2145  piperacillin-tazobactam (ZOSYN) IVPB 3.375 g       ? 3.375 g ?100 mL/hr over 30 Minutes Intravenous  Once 09/23/21 2139 09/23/21 2234  ? ?  ? ? ? ?I have personally reviewed the following labs and images: ?CBC: ?Recent Labs  ?Lab 09/23/21 ?1946 09/24/21 ?0316 09/25/21 ?0300 09/26/21 ?0402  ?WBC 3.9* 4.0 3.6* 3.2*  ?NEUTROABS 2.4  --   --   --   ?HGB 15.4 13.8 13.8 13.1  ?HCT 43.8 41.1 41.1 38.5*  ?MCV 85.2 88.8 88.8 87.3  ?PLT 176 157 158 142*  ? ?BMP &GFR ?Recent Labs  ?Lab 09/23/21 ?1946 09/24/21 ?0316 09/25/21 ?0300 09/26/21 ?0402  ?NA 134* 136 138 141  ?K 3.8 3.9 4.2 4.0  ?CL 104 104 104 108  ?CO2 20* 25 27 29   ?GLUCOSE 96 90 90 98  ?BUN 17 14 16 7   ?CREATININE 0.97 1.08 1.00 0.90  ?CALCIUM 8.7* 8.4* 8.6* 8.6*  ?MG  --   --  2.2 2.0  ?PHOS  --   --  4.7* 3.7  ? ?Estimated Creatinine Clearance: 153.4 mL/min (by C-G formula based on SCr of 0.9 mg/dL). ?Liver & Pancreas: ?Recent Labs  ?Lab 09/23/21 ?1946 09/25/21 ?0300 09/26/21 ?0402  ?AST 22  --   --   ?ALT 26  --   --   ?ALKPHOS 52  --   --   ?BILITOT 0.7  --   --   ?PROT 7.3  --   --   ?ALBUMIN 4.3 3.6 3.4*  ? ?Recent Labs  ?Lab 09/23/21 ?1946  ?LIPASE 37  ? ?No results for input(s): AMMONIA in the last 168 hours. ?Diabetic: ?No results for input(s): HGBA1C in the last 72 hours. ?No results for input(s): GLUCAP in the last 168 hours. ?Cardiac Enzymes: ?No results for input(s): CKTOTAL, CKMB, CKMBINDEX, TROPONINI in the last 168 hours. ?No results for input(s): PROBNP in the last 8760 hours. ?Coagulation Profile: ?No results for input(s): INR, PROTIME in the last 168 hours. ?Thyroid Function Tests: ?No results for input(s): TSH, T4TOTAL, FREET4, T3FREE, THYROIDAB in the last 72 hours. ?Lipid Profile: ?No results for input(s): CHOL, HDL, LDLCALC, TRIG, CHOLHDL, LDLDIRECT in the last 72 hours. ?Anemia Panel: ?No results for input(s): VITAMINB12, FOLATE, FERRITIN, TIBC, IRON, RETICCTPCT in the last 72 hours. ?Urine analysis: ?   ?Component Value  Date/Time  ? COLORURINE YELLOW 09/23/2021 1920  ? APPEARANCEUR CLEAR 09/23/2021 1920  ? LABSPEC 1.032 (H) 09/23/2021 1920  ? PHURINE 5.0 09/23/2021 1920  ? GLUCOSEU NEGATIVE 09/23/2021 1920  ? HGBUR SMALL (A) 09/23/2021 1920  ? BILIRUBINUR NEGATIVE 09/23/2021 1920  ? KETONESUR 20 (A) 09/23/2021 1920  ? PROTEINUR 100 (A) 09/23/2021 1920  ? NITRITE NEGATIVE 09/23/2021 1920  ? LEUKOCYTESUR SMALL (A) 09/23/2021 1920  ? ?Sepsis Labs: ?Invalid input(s): PROCALCITONIN, LACTICIDVEN ? ?Microbiology: ?No results found for this or any previous visit (from the past 240 hour(s)). ? ?Radiology Studies: ?No results found. ? ? ? ?Ikechukwu Cerny T. Shahil Speegle ?[facility] ? ?If 7PM-7AM, please contact night-coverage ?[email or web address] ?09/26/2021, 12:04 PM   ?
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
## 2021-09-26 NOTE — Progress Notes (Signed)
? ?Progress Note ? ?   ?Subjective: ?Pain is stable this morning - still feeling sore in LLQ - but did not worsen with clear liquids yesterday. Having bowel movements without pain. No nausea or emesis ? ?Objective: ?Vital signs in last 24 hours: ?Temp:  [97.9 ?F (36.6 ?C)-98.9 ?F (37.2 ?C)] 97.9 ?F (36.6 ?C) (04/04 0549) ?Pulse Rate:  [50-76] 50 (04/04 0549) ?Resp:  [15-18] 18 (04/04 0549) ?BP: (106-132)/(68-89) 106/68 (04/04 0549) ?SpO2:  [98 %-100 %] 98 % (04/04 0549) ?Last BM Date : 09/25/21 ? ?Intake/Output from previous day: ?04/03 0701 - 04/04 0700 ?In: 4792 [P.O.:1680; I.V.:2811.9; IV Piggyback:300.1] ?Out: 1150 [Urine:1150] ?Intake/Output this shift: ?No intake/output data recorded. ? ?PE: ?General: pleasant, WD, male who is laying in bed in NAD ?HEENT: head is normocephalic, atraumatic.  Mouth is pink and moist ?Heart: Palpable radial pulses bilaterally ?Lungs: Respiratory effort nonlabored on room air ?Abd: soft, ND, +BS, mild TTP in LLQ without rebound or guarding ?MSK: all 4 extremities are symmetrical with no cyanosis, clubbing, or edema. ?Skin: warm and dry ?Psych: A&Ox3 with an appropriate affect.  ? ? ?Lab Results:  ?Recent Labs  ?  09/25/21 ?0300 09/26/21 ?0402  ?WBC 3.6* 3.2*  ?HGB 13.8 13.1  ?HCT 41.1 38.5*  ?PLT 158 142*  ? ? ?BMET ?Recent Labs  ?  09/25/21 ?0300 09/26/21 ?0402  ?NA 138 141  ?K 4.2 4.0  ?CL 104 108  ?CO2 27 29  ?GLUCOSE 90 98  ?BUN 16 7  ?CREATININE 1.00 0.90  ?CALCIUM 8.6* 8.6*  ? ? ?PT/INR ?No results for input(s): LABPROT, INR in the last 72 hours. ?CMP  ?   ?Component Value Date/Time  ? NA 141 09/26/2021 0402  ? K 4.0 09/26/2021 0402  ? CL 108 09/26/2021 0402  ? CO2 29 09/26/2021 0402  ? GLUCOSE 98 09/26/2021 0402  ? BUN 7 09/26/2021 0402  ? CREATININE 0.90 09/26/2021 0402  ? CALCIUM 8.6 (L) 09/26/2021 0402  ? PROT 7.3 09/23/2021 1946  ? ALBUMIN 3.4 (L) 09/26/2021 0402  ? AST 22 09/23/2021 1946  ? ALT 26 09/23/2021 1946  ? ALKPHOS 52 09/23/2021 1946  ? BILITOT 0.7 09/23/2021  1946  ? GFRNONAA >60 09/26/2021 0402  ? GFRAA >60 01/26/2019 0449  ? ?Lipase  ?   ?Component Value Date/Time  ? LIPASE 37 09/23/2021 1946  ? ? ? ? ? ?Studies/Results: ?No results found. ? ?Anti-infectives: ?Anti-infectives (From admission, onward)  ? ? Start     Dose/Rate Route Frequency Ordered Stop  ? 09/25/21 1400  Ampicillin-Sulbactam (UNASYN) 3 g in sodium chloride 0.9 % 100 mL IVPB       ? 3 g ?200 mL/hr over 30 Minutes Intravenous Every 6 hours 09/25/21 0905    ? 09/24/21 0745  piperacillin-tazobactam (ZOSYN) IVPB 3.375 g  Status:  Discontinued       ? 3.375 g ?12.5 mL/hr over 240 Minutes Intravenous Every 8 hours 09/24/21 0652 09/25/21 0905  ? 09/23/21 2145  piperacillin-tazobactam (ZOSYN) IVPB 3.375 g       ? 3.375 g ?100 mL/hr over 30 Minutes Intravenous  Once 09/23/21 2139 09/23/21 2234  ? ?  ? ? ? ?Assessment/Plan ?Diverticulitis with microperforation ?- CT 4/1 with diverticulitis and microperf at hepatic flexure ?- No current indication for emergency surgery ?- continue IV abx ?- pain stable. Agree with advance to fulls but encouraged patient to go slow given he is still having some pain/soreness in LLQ ?- this is his third episode. Hopeful for  improvement with medical management. Have placed follow up information for GI and colorectal surgeon outpatient ?- Discussed if he was to fail to improve may obtain follow up CT scan to rule out abscess and if he was to fail to improve with conservative management or was to acutely worsen he may need surgery during admission that would likely result in a colectomy and colostomy.  ? ?FEN: FLD ?ID: zosyn 4/1>4/2, unasyn 4/3>> ?VTE: lovenox ? ?I reviewed hospitalist notes, last 24 h vitals and pain scores, last 48 h intake and output, and last 24 h labs and trends. ? ? LOS: 2 days  ? ?Eric Form, PA-C ?[facility] ?09/26/2021, 7:56 AM ?Please see Amion for pager number during day hours 7:00am-4:30pm ? ?

## 2021-09-27 ENCOUNTER — Encounter: Payer: Self-pay | Admitting: Nurse Practitioner

## 2021-09-27 LAB — RENAL FUNCTION PANEL
Albumin: 3.7 g/dL (ref 3.5–5.0)
Anion gap: 7 (ref 5–15)
BUN: 6 mg/dL (ref 6–20)
CO2: 29 mmol/L (ref 22–32)
Calcium: 9 mg/dL (ref 8.9–10.3)
Chloride: 106 mmol/L (ref 98–111)
Creatinine, Ser: 0.88 mg/dL (ref 0.61–1.24)
GFR, Estimated: >60 mL/min (ref >60)
Glucose, Bld: 90 mg/dL (ref 70–99)
Phosphorus: 4.6 mg/dL (ref 2.5–4.6)
Potassium: 3.8 mmol/L (ref 3.5–5.1)
Sodium: 142 mmol/L (ref 135–145)

## 2021-09-27 LAB — CBC WITH DIFFERENTIAL/PLATELET
Basophils Absolute: 0 10*3/uL (ref 0.0–0.1)
Basophils Relative: 0 %
Eosinophils Absolute: 0.1 10*3/uL (ref 0.0–0.5)
Eosinophils Relative: 2 %
HCT: 41.5 % (ref 39.0–52.0)
Hemoglobin: 13.8 g/dL (ref 13.0–17.0)
Lymphocytes Relative: 61 %
Lymphs Abs: 1.9 10*3/uL (ref 0.7–4.0)
MCH: 29.3 pg (ref 26.0–34.0)
MCHC: 33.3 g/dL (ref 30.0–36.0)
MCV: 88.1 fL (ref 80.0–100.0)
Monocytes Absolute: 0.3 10*3/uL (ref 0.1–1.0)
Monocytes Relative: 8 %
Neutro Abs: 0.9 10*3/uL — ABNORMAL LOW (ref 1.7–7.7)
Neutrophils Relative %: 29 %
Platelets: 166 10*3/uL (ref 150–400)
RBC: 4.71 MIL/uL (ref 4.22–5.81)
RDW: 11.9 % (ref 11.5–15.5)
WBC Morphology: >10
WBC: 3.2 10*3/uL — ABNORMAL LOW (ref 4.0–10.5)
nRBC: 0 % (ref 0.0–0.2)

## 2021-09-27 LAB — MAGNESIUM: Magnesium: 2.1 mg/dL (ref 1.7–2.4)

## 2021-09-27 MED ORDER — AMOXICILLIN-POT CLAVULANATE 875-125 MG PO TABS: 1.0000 | ORAL_TABLET | Freq: Two times a day (BID) | ORAL | 0 refills | Status: AC

## 2021-09-27 NOTE — Discharge Summary (Signed)
?Physician Discharge Summary  ?Matthew English SFK:[id number] DOB: 05-11-1986 DOA: 09/23/2021 ? ?PCP: Patient, No Pcp Per (Inactive) ? ?Admit date: 09/23/2021 ?Discharge date: 09/27/2021 ? ?Admitted From: Home ?Disposition: Home ? ?Recommendations for Outpatient Follow-up:  ?Follow up with PCP in 1-2 weeks ?Follow-up with [facility] gastroenterology for colonoscopy in 6-8 weeks following recurrent acute diverticulitis with microperforation ?Follow-up with general surgery, colorectal surgeon following colonoscopy ?Continue Augmentin following discharge to complete 14-day course of antibiotics  ? ?Home Health: No ?Equipment/Devices: None ? ?Discharge Condition: Stable ?CODE STATUS: Full code ?Diet recommendation: Low fiber diet next few days until bowel movements normal and abdominal symptoms resolved then continue a high-fiber diet ? ?History of present illness: ? ?Matthew English is a 36 year old male with past medical history significant for diverticulitis in 2018 and 2020 presenting with lower abdominal pain and diarrhea and found to have recurrent acute diverticulitis.  CT abdomen and pelvis confirmed acute diverticulitis with microperforation at hepatic flexure.  No abscess identified.  Started on IV Zosyn.  General surgery consulted.  Hospital service consulted for further evaluation management. ? ?Hospital course: ? ?Assessment and Plan: ?* Acute diverticulitis with microperforation ?Presents with lower abdominal pain and diarrhea.  CT confirms acute diverticulitis with microperforation at hepatic flexure.  Had episodes of diverticulitis in 2018 and 2020.  General surgery was consulted and followed during hospital course.  Patient was initially started on IV Zosyn and transition to Unasyn.  Patient's diet was slowly advanced with toleration with resolution of abdominal pain.  We will continue Augmentin 875-125 mg p.o. twice daily to complete 14-day course on discharge.  Ambulatory referral placed to gastroenterology for  colonoscopy in 6-8 weeks.  Will need close follow-up with general surgery following colonoscopy. ? ?Leukopenia ?Likely from infection.  Continue antibiotics as above. ? ?Obesity (BMI 30-39.9) ?Body mass index is 30.81 kg/m?.  Etiology likely secondary to muscular mass given his body habitus. ? ? ? ? ? ? ?Discharge Diagnoses:  ?Principal Problem: ?  Acute diverticulitis with microperforation ?Active Problems: ?  Obesity (BMI 30-39.9) ?  Leukopenia ? ? ? ?Discharge Instructions ? ?Discharge Instructions   ? ? Ambulatory referral to Gastroenterology   Complete by: As directed ?  ? Admission for diverticulitis with microperforation. Needs colonoscopy in 6-8 weeks  ? What is the reason for referral?: Colonoscopy  ? Call MD for:  difficulty breathing, headache or visual disturbances   Complete by: As directed ?  ? Call MD for:  extreme fatigue   Complete by: As directed ?  ? Call MD for:  persistant dizziness or light-headedness   Complete by: As directed ?  ? Call MD for:  persistant nausea and vomiting   Complete by: As directed ?  ? Call MD for:  severe uncontrolled pain   Complete by: As directed ?  ? Call MD for:  temperature >100.4   Complete by: As directed ?  ? Diet - low sodium heart healthy   Complete by: As directed ?  ? Increase activity slowly   Complete by: As directed ?  ? ?  ? ?Allergies as of 09/27/2021   ?No Known Allergies ?  ? ?  ?Medication List  ?  ? ?STOP taking these medications   ? ?metroNIDAZOLE 500 MG tablet ?Commonly known as: FLAGYL ?  ? ?  ? ?TAKE these medications   ? ?amoxicillin-clavulanate 875-125 MG tablet ?Commonly known as: Augmentin ?Take 1 tablet by mouth 2 (two) times daily for 12 days. ?  ? ?  ? ?  Follow-up Information   ? ? [facility]. Call.   ?Specialty: Gastroenterology ?Why: Their office should contact you to arrange for a colonoscopy in 6-8 weeks ?Contact information: ?[address] [place] ?[place] [address] ?[phone] ? ?  ?  ? ? [facility] [place]. Call.   ?Specialty: General Surgery ?Why: Call our office after your colonoscopy to arrange follow up with one of our colorectal specialists to discuss surgery ?Contact information: ?[address] ?[address] ?[place] [address] ?[phone] ? ?  ?  ? ?  ?  ? ?  ? ?No Known Allergies ? ?Consultations: ?General surgery ? ? ?Procedures/Studies: ?CT Abdomen Pelvis W Contrast ? ?Addendum Date: 09/23/2021   ?ADDENDUM REPORT: 09/23/2021 21:32 ADDENDUM: Critical findings were reported to Dr. Effie Shy at 9:31 p.m. Electronically Signed   By: Thornell Sartorius M.D.   On: 09/23/2021 21:32  ? ?Result Date: 09/23/2021 ?CLINICAL DATA:  Lower abdominal pain for 5 days with diarrhea. History of diverticulitis. EXAM: CT ABDOMEN AND PELVIS WITH CONTRAST TECHNIQUE: Multidetector CT imaging of the abdomen and pelvis was performed using the standard protocol following bolus administration of intravenous contrast. RADIATION DOSE REDUCTION: This exam was performed according to the departmental dose-optimization program which includes automated exposure control, adjustment of the mA and/or kV according to patient size and/or use of iterative reconstruction technique. CONTRAST:  OMNIPAQUE IOHEXOL 300 MG/ML  SOLN COMPARISON:  01/22/2019. FINDINGS: Lower chest: No acute abnormality. Hepatobiliary: No focal liver abnormality is seen. No gallstones, gallbladder wall thickening, or biliary dilatation. Pancreas: Unremarkable. No pancreatic ductal dilatation or surrounding inflammatory changes. Spleen: Normal in size without focal abnormality. Adrenals/Urinary Tract: Adrenal glands are unremarkable. Kidneys are normal, without renal calculi, focal lesion, or hydronephrosis. Bladder is unremarkable. Stomach/Bowel: The stomach is within normal limits. Multiple scattered diverticula are present along the colon. There is mild bowel wall thickening with surrounding fat stranding involving the colon at the  hepatic flexure, compatible with diverticulitis. A few tiny locules of air are seen in the mesentery in this region, concerning for microperforation. No abscess is identified. No pneumatosis. A normal appendix is seen in the right lower quadrant. Vascular/Lymphatic: No significant vascular findings are present. No enlarged abdominal or pelvic lymph nodes. Reproductive: Prostate is unremarkable. Other: Small fat containing inguinal hernias are noted bilaterally. There is a small fat containing umbilical hernia. A trace amount of free fluid is noted in the anterior pararenal space on the right. Musculoskeletal: No acute or suspicious osseous abnormality. IMPRESSION: Acute diverticulitis involving the colon at the hepatic flexure with a few foci of free air in the pericolonic soft tissues suggesting microperforation. No abscess is identified. Electronically Signed: By: Thornell Sartorius M.D. On: 09/23/2021 21:29   ? ? ?Subjective: ?Patient seen examined bedside, resting comfort.  Abdominal pain completely resolved.  Reports bowel movement without any blood present.  Tolerating advance diet.  Seen by general surgery this morning okay for discharge home with outpatient follow-up.  Discussed with patient will need colonoscopy in 6-8 weeks followed by reevaluation by general surgery.  No other questions or concerns at this time.  Denies headache, no dizziness, no chest pain, no palpitations, no shortness of breath, no abdominal pain, no weakness, no fatigue, no paresthesias.  No acute events overnight per nursing staff. ? ?Discharge Exam: ?Vitals:  ? 09/27/21 0521 09/27/21 1234  ?BP: 123/88 (!) 139/91  ?Pulse: 82 76  ?Resp: 18 18  ?Temp: 98.4 ?F (36.9 ?C) 97.7 ?F (36.5 ?C)  ?  SpO2: 100% 100%  ? ?Vitals:  ? 09/26/21 1234 09/26/21 2056 09/27/21 0521 09/27/21 1234  ?BP: (!) 135/92 (!) 141/93 123/88 (!) 139/91  ?Pulse: 70 68 82 76  ?Resp: 18 18 18 18   ?Temp: 98.1 ?F (36.7 ?C) 97.9 ?F (36.6 ?C) 98.4 ?F (36.9 ?C) 97.7 ?F (36.5 ?C)   ?TempSrc: Oral Oral Oral Oral  ?SpO2: 99% 99% 100% 100%  ?Weight:      ?Height:      ? ? ?Physical Exam: ?GEN: NAD, alert and oriented x 3, wd/wn ?HEENT: NCAT, PERRL, EOMI, sclera clear, MMM ?PULM: CTAB w/o wheez

## 2021-09-27 NOTE — Progress Notes (Signed)
? ?Progress Note ? ?   ?Subjective: ?Feeling much better today. Abdominal pain resolved. Still having bowel movements. No nausea or emesis on fulls ? ?Objective: ?Vital signs in last 24 hours: ?Temp:  [97.9 ?F (36.6 ?C)-98.4 ?F (36.9 ?C)] 98.4 ?F (36.9 ?C) (04/05 1884) ?Pulse Rate:  [68-82] 82 (04/05 0521) ?Resp:  [18] 18 (04/05 0521) ?BP: (123-141)/(88-93) 123/88 (04/05 0521) ?SpO2:  [99 %-100 %] 100 % (04/05 0521) ?Last BM Date : 09/26/21 ? ?Intake/Output from previous day: ?04/04 0701 - 04/05 0700 ?In: 3091.3 [P.O.:1840; I.V.:851.2; IV Piggyback:400.1] ?Out: 650 [Urine:650] ?Intake/Output this shift: ?No intake/output data recorded. ? ?PE: ?General: pleasant, WD, male who is laying in bed in NAD ?HEENT: head is normocephalic, atraumatic.  Mouth is pink and moist ?Heart: Palpable radial pulses bilaterally ?Lungs: Respiratory effort nonlabored on room air ?Abd: soft, ND, +BS, Nontender to palpation ?MSK: all 4 extremities are symmetrical with no cyanosis, clubbing, or edema. ?Skin: warm and dry ?Psych: A&Ox3 with an appropriate affect.  ? ? ?Lab Results:  ?Recent Labs  ?  09/26/21 ?0402 09/27/21 ?0414  ?WBC 3.2* 3.2*  ?HGB 13.1 13.8  ?HCT 38.5* 41.5  ?PLT 142* 166  ? ? ?BMET ?Recent Labs  ?  09/26/21 ?0402 09/27/21 ?0414  ?NA 141 142  ?K 4.0 3.8  ?CL 108 106  ?CO2 29 29  ?GLUCOSE 98 90  ?BUN 7 6  ?CREATININE 0.90 0.88  ?CALCIUM 8.6* 9.0  ? ? ?PT/INR ?No results for input(s): LABPROT, INR in the last 72 hours. ?CMP  ?   ?Component Value Date/Time  ? NA 142 09/27/2021 0414  ? K 3.8 09/27/2021 0414  ? CL 106 09/27/2021 0414  ? CO2 29 09/27/2021 0414  ? GLUCOSE 90 09/27/2021 0414  ? BUN 6 09/27/2021 0414  ? CREATININE 0.88 09/27/2021 0414  ? CALCIUM 9.0 09/27/2021 0414  ? PROT 7.3 09/23/2021 1946  ? ALBUMIN 3.7 09/27/2021 0414  ? AST 22 09/23/2021 1946  ? ALT 26 09/23/2021 1946  ? ALKPHOS 52 09/23/2021 1946  ? BILITOT 0.7 09/23/2021 1946  ? GFRNONAA >60 09/27/2021 0414  ? GFRAA >60 01/26/2019 0449  ? ?Lipase  ?    ?Component Value Date/Time  ? LIPASE 37 09/23/2021 1946  ? ? ? ? ? ?Studies/Results: ?No results found. ? ?Anti-infectives: ?Anti-infectives (From admission, onward)  ? ? Start     Dose/Rate Route Frequency Ordered Stop  ? 09/25/21 1400  Ampicillin-Sulbactam (UNASYN) 3 g in sodium chloride 0.9 % 100 mL IVPB       ? 3 g ?200 mL/hr over 30 Minutes Intravenous Every 6 hours 09/25/21 0905    ? 09/24/21 0745  piperacillin-tazobactam (ZOSYN) IVPB 3.375 g  Status:  Discontinued       ? 3.375 g ?12.5 mL/hr over 240 Minutes Intravenous Every 8 hours 09/24/21 0652 09/25/21 0905  ? 09/23/21 2145  piperacillin-tazobactam (ZOSYN) IVPB 3.375 g       ? 3.375 g ?100 mL/hr over 30 Minutes Intravenous  Once 09/23/21 2139 09/23/21 2234  ? ?  ? ? ? ?Assessment/Plan ?Diverticulitis with microperforation ?- CT 4/1 with diverticulitis and microperf at hepatic flexure ?- No current indication for emergency surgery ?- continue IV abx ?- this is his third episode. Hopeful for improvement with medical management. Have placed follow up information for GI and colorectal surgeon outpatient ?- pain is resolved this am, WBC remains low, afebrile. Advance to soft diet this am. If this goes well he could discharge as early as this  afternoon from surgical perspective. Discussed with patient again return precautions, dietary recommendations, follow up recommendations ?- he will need to discharge on abx such as PO Augmentin to complete a 14 day course total  ? ?FEN: soft ?ID: zosyn 4/1>4/2, unasyn 4/3>> ?VTE: lovenox ? ?I reviewed hospitalist notes, last 24 h vitals and pain scores, last 48 h intake and output, and last 24 h labs and trends. ? ? LOS: 3 days  ? ?Eric Form, PA-C ?[facility] ?09/27/2021, 10:00 AM ?Please see Amion for pager number during day hours 7:00am-4:30pm ? ?

## 2021-09-27 NOTE — Discharge Instructions (Signed)
Continue a low fiber diet at home for the next few days until bowel movements are normal and abdominal symptoms resolved. Then continue a high fiber diet going forward to maintain regular bowel movements/prevent constipation  ?

## 2021-09-27 NOTE — Plan of Care (Signed)
Instructions were reviewed with patient. All questions were answered. Patient was transported to main entrance by wheelchair. ° °

## 2021-10-23 ENCOUNTER — Encounter: Payer: Self-pay | Admitting: Nurse Practitioner

## 2021-10-23 ENCOUNTER — Ambulatory Visit: Payer: Federal, State, Local not specified - PPO | Admitting: Nurse Practitioner

## 2021-10-23 VITALS — BP 118/88 | HR 75 | Ht 71.5 in | Wt 229.4 lb

## 2021-10-23 DIAGNOSIS — Z8719 Personal history of other diseases of the digestive system: Secondary | ICD-10-CM | POA: Diagnosis not present

## 2021-10-23 NOTE — Patient Instructions (Signed)
You have been scheduled for a colonoscopy. Please follow the written instructions given to you at your visit today. ?If you use inhalers (even only as needed), please bring them with you on the day of your procedure. ? ?RECOMMENDATIONS: ? ?Please contact our office if abdominal pain recurs. ?Miralax- Dissolve 1/2 to 1 capful in 8 ounces of your choice clear liquid and drink before bed as needed to avoid straining and hard stools. ? ?Thank you for trusting me with your gastrointestinal care!   ? ?Noralyn Pick, CRNP ? ? ? ?BMI: ? ?If you are age 25 or older, your body mass index should be between 23-30. Your Body mass index is 31.55 kg/m?Marland Kitchen If this is out of the aforementioned range listed, please consider follow up with your Primary Care Provider. ? ?If you are age 58 or younger, your body mass index should be between 19-25. Your Body mass index is 31.55 kg/m?Marland Kitchen If this is out of the aformentioned range listed, please consider follow up with your Primary Care Provider.  ? ?MY CHART: ? ?The [facility] providers would like to encourage you to use [facility] to communicate with providers for non-urgent requests or questions.  Due to long hold times on the telephone, sending your provider a message by [facility] may be a faster and more efficient way to get a response.  Please allow 48 business hours for a response.  Please remember that this is for non-urgent requests.  ? ? ?

## 2021-10-23 NOTE — Progress Notes (Signed)
? ? ? ?10/23/2021 ?Matthew English ?[id number] ?10/31/1985 ? ? ?CHIEF COMPLAINT: Diverticulitis ? ?HISTORY OF PRESENT ILLNESS: Matthew English is a 36 year old male with a past medical history of diverticulitis.  Nasal surgery 2014.  He presents to our office today as referred by general surgeon Dr. Lilly Cove Meuth PA-C to consider a diagnostic colonoscopy due recurrent diverticulitis at the young age of 63.  His first diverticulitis episode was in 2018, CT imaging identified focal sigmoid colitis with intramural and mesenteric 2 mm abscess.  His second episode of diverticulitis was in 2020 and was associated with microperforation at the distal descending and proximal sigmoid. His most recent episode occurred towards the end of March 2023.  At that time, he developed generalized mid lower abdominal pain which radiated towards the LLQ area for 7 days.  He had difficulty pushing stool out and felt constipated.  He presented to the ED 09/23/2021 for further evaluation.  Labs in the ED showed a WBC count of 3.9.  Hemoglobin 15.4.  CTAP with contrast identified acute diverticulitis at the hepatic flexure with a few foci of free air in the pericolonic soft tissue suggestive of a microperforation without an abscess.  He was admitted to the hospital, he was placed NPO and was treated with IV antibiotics. He as seen by general surgery and he was advised to schedule an outpatient colonoscopy prior to considering a future elective colon resection.  His clinical status improved and he was transition from IV antibiotics to Augmentin p.o. twice daily to complete a total of 14 days and was discharged home 09/27/2021. ? ?Currently, he feels well.  His abdominal pain abated within 1 week of taking Augmentin as prescribed.  He describes a residual central to LLQ pressure sensation.  No significant abdominal pain.  He continues to have intermittent constipation and intermittent diarrhea.  He typically passes 3 to 4 nonbloody  diarrhea bowel movements one day every 2 weeks.  His last episode of diarrhea was approximately 2 weeks ago.  Since then, he is passing normal formed brown bowel movement most days.  He denies taking any laxatives.  He is eating a healthier diet.  He endorses losing 30 pounds over the past year, some of this weight loss possibly from diet changes.  No fever, sweats or chills.  No known family history of esophageal, gastric or colon cancer. ? ? ? ?  Latest Ref Rng & Units 09/27/2021  ?  4:14 AM 09/26/2021  ?  4:02 AM 09/25/2021  ?  3:00 AM  ?CBC  ?WBC 4.0 - 10.5 K/uL 3.2   3.2   3.6    ?Hemoglobin 13.0 - 17.0 g/dL 13.8   13.1   13.8    ?Hematocrit 39.0 - 52.0 % 41.5   38.5   41.1    ?Platelets 150 - 400 K/uL 166   142   158    ?  ? ?  Latest Ref Rng & Units 09/27/2021  ?  4:14 AM 09/26/2021  ?  4:02 AM 09/25/2021  ?  3:00 AM  ?CMP  ?Glucose 70 - 99 mg/dL 90   98   90    ?BUN 6 - 20 mg/dL 6   7   16     ?Creatinine 0.61 - 1.24 mg/dL 0.88   0.90   1.00    ?Sodium 135 - 145 mmol/L 142   141   138    ?Potassium 3.5 - 5.1 mmol/L 3.8   4.0  4.2    ?Chloride 98 - 111 mmol/L 106   108   104    ?CO2 22 - 32 mmol/L 29   29   27     ?Calcium 8.9 - 10.3 mg/dL 9.0   8.6   8.6    ?  ?CTAP with contrast 09/23/2021:  ?Lower chest: No acute abnormality. ?  ?Hepatobiliary: No focal liver abnormality is seen. No gallstones, ?gallbladder wall thickening, or biliary dilatation. ?  ?Pancreas: Unremarkable. No pancreatic ductal dilatation or ?surrounding inflammatory changes. ?  ?Spleen: Normal in size without focal abnormality. ?  ?Adrenals/Urinary Tract: Adrenal glands are unremarkable. Kidneys are ?normal, without renal calculi, focal lesion, or hydronephrosis. ?Bladder is unremarkable. ?  ?Stomach/Bowel: The stomach is within normal limits. Multiple ?scattered diverticula are present along the colon. There is mild ?bowel wall thickening with surrounding fat stranding involving the ?colon at the hepatic flexure, compatible with diverticulitis. A  few ?tiny locules of air are seen in the mesentery in this region, ?concerning for microperforation. No abscess is identified. No ?pneumatosis. A normal appendix is seen in the right lower quadrant. ?  ?Vascular/Lymphatic: No significant vascular findings are present. No ?enlarged abdominal or pelvic lymph nodes. ?  ?Reproductive: Prostate is unremarkable. ?  ?Other: Small fat containing inguinal hernias are noted bilaterally. ?There is a small fat containing umbilical hernia. A trace amount of ?free fluid is noted in the anterior pararenal space on the right. ?  ?Musculoskeletal: No acute or suspicious osseous abnormality. ? ?Acute diverticulitis involving the colon at the hepatic flexure with ?a few foci of free air in the pericolonic soft tissues suggesting ?microperforation. No abscess is identified. ? ?CTAP with contrast 12/06/2016: ?Focal sigmoid colitis with intramural and mesenteric 22 mm abscess. ?Usually, this appearance is from complicated diverticulitis; at ?least 1 colonic diverticulum is seen elsewhere. No pneumoperitoneum ?or drainable collection. ? ?CTAP with contrast 01/22/2019: ?1. Distal descending colon and proximal sigmoid diverticulitis with ?evidence of microperforation. There is soft tissue stranding and ?fluid in this area of acute diverticulitis without frank abscess. ?  ?2. Moderate diverticulosis without frank diverticulitis more ?distally in the sigmoid colon. ?  ?3. No bowel obstruction. No abscess in the abdomen or pelvis. ?Appendix appears normal. ?  ?4. No demonstrable renal or ureteral calculus. No hydronephrosis. ?Urinary bladder wall thickness is within normal limits. ? ? ?Social History: He is single.  He has 2 sons and 1 daughter.  He is employed by [employer].  Vape on and off x 2 years, no longer vaping past 1 1/2 years. He drinks one glass of wine once or twice monthly.  ? ?Family History:  Mother with history of heart disease. Father with HTN. No family history of esophageal,  gastric or colon cancer.  ? ?No Known Allergies ? ?  ?No outpatient encounter medications on file as of 10/23/2021.  ? ?No facility-administered encounter medications on file as of 10/23/2021.  ? ? ?REVIEW OF SYSTEMS:  ?Gen: Denies fever, sweats or chills. No weight loss.  ?CV: Denies chest pain, palpitations or edema. ?Resp: Denies cough, shortness of breath of hemoptysis.  ?GI: Denies heartburn, dysphagia, stomach or lower abdominal pain. No diarrhea or constipation.  ?GU : Denies urinary burning, blood in urine, increased urinary frequency or incontinence. ?MS: Denies joint pain, muscles aches or weakness. ?Derm: Denies rash, itchiness, skin lesions or unhealing ulcers. ?Psych: Denies depression, anxiety or memory loss. ?Heme: Denies bruising, easy bleeding. ?Neuro:  Denies headaches, dizziness or paresthesias. ?Endo:  Denies  any problems with DM, thyroid or adrenal function. ? ?PHYSICAL EXAM: ?Ht 5' 11.5" (1.816 m) Comment: height measured without shoes  Wt 229 lb 6 oz (104 kg)   BMI 31.55 kg/m?  ?General: 36 year old male in no acute distress. ?Head: Normocephalic and atraumatic. ?Eyes:  Sclerae non-icteric, conjunctive pink. ?Ears: Normal auditory acuity. ?Mouth: Dentition intact. No ulcers or lesions.  ?Neck: Supple, no lymphadenopathy or thyromegaly.  ?Lungs: Clear bilaterally to auscultation without wheezes, crackles or rhonchi. ?Heart: Regular rate and rhythm. No murmur, rub or gallop appreciated.  ?Abdomen: Soft, nontender, non distended.  Patient describes feeling pressure when central lower and LLQ areas palpated. No rebound or guarding. No masses. No hepatosplenomegaly. Normoactive bowel sounds x 4 quadrants.  ?Rectal: Deferred. ?Musculoskeletal: Symmetrical with no gross deformities. ?Skin: Warm and dry. No rash or lesions on visible extremities. ?Extremities: No edema. ?Neurological: Alert oriented x 4, no focal deficits.  ?Psychological:  Alert and cooperative. Normal mood and affect. ? ?ASSESSMENT  AND PLAN: ? ?21) 36 year old male with recurrent diverticulitis x 3 episodes 2018, 2022 and 09/2021. He was admitted to the hospital 4/1 - 09/27/2021 with lower abdominal pain x 7 days. CTAP showed acute diverticulitis at

## 2021-10-26 NOTE — Progress Notes (Signed)
Ammie, please contact the patient and let him know Dr. Candis Schatz recommended for him to eat a high-fiber diet and/or to use a fiber supplement such as Metamucil daily to reduce the risk of diverticular complications.  Thank you ?

## 2021-10-26 NOTE — Progress Notes (Signed)
Agree with the assessment and plan as outlined by Alcide Evener, NP.  Would also recommend patient engage in high-fiber diet or use a daily fiber supplement such as Metamucil.  There is some evidence this may help reduce risk of diverticular complications. ? ? ?Ayva Veilleux E. Tomasa Rand, MD ?[facility] ? ?

## 2021-10-26 NOTE — Progress Notes (Signed)
Called pt and informed of Dr. Milas Hock recommendations as documented by Alcide Evener, CRNP. Verbalized acceptance and understanding. ?

## 2021-11-09 ENCOUNTER — Encounter: Payer: Self-pay | Admitting: Gastroenterology

## 2021-11-17 ENCOUNTER — Ambulatory Visit (AMBULATORY_SURGERY_CENTER): Payer: Federal, State, Local not specified - PPO | Admitting: Gastroenterology

## 2021-11-17 ENCOUNTER — Encounter: Payer: Self-pay | Admitting: Gastroenterology

## 2021-11-17 VITALS — BP 110/74 | HR 65 | Temp 98.4°F | Resp 13 | Ht 71.0 in | Wt 229.0 lb

## 2021-11-17 DIAGNOSIS — K5792 Diverticulitis of intestine, part unspecified, without perforation or abscess without bleeding: Secondary | ICD-10-CM | POA: Diagnosis not present

## 2021-11-17 DIAGNOSIS — K639 Disease of intestine, unspecified: Secondary | ICD-10-CM | POA: Diagnosis not present

## 2021-11-17 DIAGNOSIS — Z8719 Personal history of other diseases of the digestive system: Secondary | ICD-10-CM | POA: Diagnosis not present

## 2021-11-17 MED ORDER — SODIUM CHLORIDE 0.9 % IV SOLN: 500.0000 mL | Freq: Once | INTRAVENOUS | Status: DC

## 2021-11-17 NOTE — Patient Instructions (Signed)
Follow colon prep instructions for rescheduled colonoscopy.    YOU HAD AN ENDOSCOPIC PROCEDURE TODAY: Refer to the procedure report and other information in the discharge instructions given to you for any specific questions about what was found during the examination. If this information does not answer your questions, please call [place] office at [phone] to clarify.   YOU SHOULD EXPECT: Some feelings of bloating in the abdomen. Passage of more gas than usual. Walking can help get rid of the air that was put into your GI tract during the procedure and reduce the bloating. If you had a lower endoscopy (such as a colonoscopy or flexible sigmoidoscopy) you may notice spotting of blood in your stool or on the toilet paper. Some abdominal soreness may be present for a day or two, also.  DIET: Your first meal following the procedure should be a light meal and then it is ok to progress to your normal diet. A half-sandwich or bowl of soup is an example of a good first meal. Heavy or fried foods are harder to digest and may make you feel nauseous or bloated. Drink plenty of fluids but you should avoid alcoholic beverages for 24 hours. If you had a esophageal dilation, please see attached instructions for diet.    ACTIVITY: Your care partner should take you home directly after the procedure. You should plan to take it easy, moving slowly for the rest of the day. You can resume normal activity the day after the procedure however YOU SHOULD NOT DRIVE, use power tools, machinery or perform tasks that involve climbing or major physical exertion for 24 hours (because of the sedation medicines used during the test).   SYMPTOMS TO REPORT IMMEDIATELY: A gastroenterologist can be reached at any hour. Please call [phone]  for any of the following symptoms:  Following lower endoscopy (colonoscopy, flexible sigmoidoscopy) Excessive amounts of blood in the stool  Significant tenderness, worsening of abdominal  pains  Swelling of the abdomen that is new, acute  Fever of 100 or higher   FOLLOW UP:  If any biopsies were taken you will be contacted by phone or by letter within the next 1-3 weeks. Call [phone]  if you have not heard about the biopsies in 3 weeks.  Please also call with any specific questions about appointments or follow up tests.

## 2021-11-17 NOTE — Progress Notes (Signed)
History and Physical Interval Note:  11/17/2021 3:41 PM  Matthew English  has presented today for endoscopic procedure(s), with the diagnosis of  Encounter Diagnosis  Name Primary?   Hx of diverticulitis of colon Yes  .  The various methods of evaluation and treatment have been discussed with the patient and/or family. After consideration of risks, benefits and other options for treatment, the patient has consented to  the endoscopic procedure(s).   The patient's history has been reviewed, patient examined, no change in status, stable for endoscopic procedure(s).  I have reviewed the patient's chart and labs.  Questions were answered to the patient's satisfaction.     Valeree Leidy E. Candis Schatz, MD [facility]

## 2021-11-17 NOTE — Op Note (Signed)
[facility] Patient Name: Matthew English Procedure Date: 11/17/2021 3:43 PM MRN: [id number] Endoscopist: Lorin Picket E. Tomasa Rand , MD Age: 36 Referring MD:  Date of Birth: 1986-05-18 Gender: Male Account #: [id number] Procedure:                Colonoscopy Indications:              Follow-up of diverticulitis Medicines:                Monitored Anesthesia Care Procedure:                Pre-Anesthesia Assessment:                           - Prior to the procedure, a History and Physical                            was performed, and patient medications and                            allergies were reviewed. The patient's tolerance of                            previous anesthesia was also reviewed. The risks                            and benefits of the procedure and the sedation                            options and risks were discussed with the patient.                            All questions were answered, and informed consent                            was obtained. Prior Anticoagulants: The patient has                            taken no previous anticoagulant or antiplatelet                            agents. ASA Grade Assessment: II - A patient with                            mild systemic disease. After reviewing the risks                            and benefits, the patient was deemed in                            satisfactory condition to undergo the procedure.                           After obtaining informed consent, the colonoscope  was passed under direct vision. Throughout the                            procedure, the patient's blood pressure, pulse, and                            oxygen saturations were monitored continuously. The                            CF [id number] #[id number] was introduced through the anus                            and advanced to the the cecum, identified by                            appendiceal orifice and  ileocecal valve. The                            colonoscopy was performed without difficulty. The                            patient tolerated the procedure well. The quality                            of the bowel preparation was poor. The ileocecal                            valve, appendiceal orifice, and rectum were                            photographed. The bowel preparation used was                            Miralax via split dose instruction. Scope In: 3:48:10 PM Scope Out: 4:04:22 PM Scope Withdrawal Time: 0 hours 11 minutes 59 seconds  Total Procedure Duration: 0 hours 16 minutes 12 seconds  Findings:                 The perianal and digital rectal examinations were                            normal. Pertinent negatives include normal                            sphincter tone and no palpable rectal lesions.                           Multiple small and large-mouthed diverticula were                            found in the sigmoid colon, descending colon,                            transverse colon and ascending colon.  Peri-diverticular erythema was seen. There was                            evidence of an impacted diverticulum. There was no                            evidence of diverticular bleeding. Biopsies were                            taken with a cold forceps for histology in the                            sigmoid colon. Estimated blood loss was minimal.                           The exam was otherwise normal throughout the                            examined colon.                           The retroflexed view of the distal rectum and anal                            verge was normal and showed no anal or rectal                            abnormalities. Complications:            No immediate complications. Estimated Blood Loss:     Estimated blood loss was minimal. Impression:               - Preparation of the colon was poor. There was                             extensive solid stool and liquid stool with solid                            debris throughout the colon which significantly                            limited visualization of the descending and sigmoid                            colon.                           - Moderate diverticulosis in the sigmoid colon, in                            the descending colon, in the transverse colon and                            in the ascending colon. Peri-diverticular erythema  was seen. There was evidence of an impacted                            diverticulum. There was no evidence of diverticular                            bleeding. Biopsied.                           - The distal rectum and anal verge are normal on                            retroflexion view. Recommendation:           - Patient has a contact number available for                            emergencies. The signs and symptoms of potential                            delayed complications were discussed with the                            patient. Return to normal activities tomorrow.                            Written discharge instructions were provided to the                            patient.                           - Resume previous diet.                           - Continue present medications.                           - Await pathology results.                           - Repeat colonoscopy at the next available                            appointment because the bowel preparation was poor. Jamee Keach E. Tomasa Rand, MD 11/17/2021 4:10:27 PM This report has been signed electronically.

## 2021-11-17 NOTE — Progress Notes (Signed)
Sedate, gd SR, tolerated procedure well, VSS, report to RN 

## 2021-11-17 NOTE — Progress Notes (Signed)
Called to room to assist during endoscopic procedure.  Patient ID and intended procedure confirmed with present staff. Received instructions for my participation in the procedure from the performing physician.  

## 2021-11-21 ENCOUNTER — Telehealth: Payer: Self-pay

## 2021-11-21 NOTE — Telephone Encounter (Signed)
Unable to leave message no voice mail set up. 

## 2021-12-05 NOTE — Progress Notes (Signed)
Matthew English,  The biopsies of your colon showed reactive changes consistent with a history of diverticulitis, but no precancerous changes and no changes suggestive of Crohn's disease.  This is good news.  Please repeat colonoscopy as planned due to poor bowel prep.

## 2021-12-07 ENCOUNTER — Telehealth: Payer: Self-pay | Admitting: Gastroenterology

## 2021-12-07 ENCOUNTER — Encounter: Payer: Federal, State, Local not specified - PPO | Admitting: Gastroenterology

## 2022-06-07 ENCOUNTER — Telehealth: Payer: Self-pay

## 2022-06-07 NOTE — Telephone Encounter (Signed)
Received call from an Rosendo Gros, states she is over the [employer] dept where the pt is employed. She wanted to verify a doctors note that the pt was given and how long he was to be out of work. Discussed with her that due to HIPAA we are unable to provide any information regarding the patient or his care without his permission. She stated she understood and would contact the pt.

## 2022-07-15 ENCOUNTER — Emergency Department (HOSPITAL_COMMUNITY): Payer: Federal, State, Local not specified - PPO

## 2022-07-15 ENCOUNTER — Emergency Department (HOSPITAL_COMMUNITY)
Admission: EM | Admit: 2022-07-15 | Discharge: 2022-07-15 | Disposition: A | Discharge status: Home / Self Care | Payer: Federal, State, Local not specified - PPO | Attending: Emergency Medicine | Admitting: Emergency Medicine

## 2022-07-15 DIAGNOSIS — N50811 Right testicular pain: Secondary | ICD-10-CM

## 2022-07-15 LAB — URINALYSIS, ROUTINE W REFLEX MICROSCOPIC
Bacteria, UA: NONE SEEN
Bilirubin Urine: NEGATIVE
Glucose, UA: NEGATIVE mg/dL
Hgb urine dipstick: NEGATIVE
Ketones, ur: NEGATIVE mg/dL
Leukocytes,Ua: NEGATIVE
Nitrite: NEGATIVE
Protein, ur: 30 mg/dL — AB
Specific Gravity, Urine: 1.029 (ref 1.005–1.030)
pH: 5 (ref 5.0–8.0)

## 2022-07-15 NOTE — ED Triage Notes (Signed)
Pt states that last night he was having intercourse when he sustained an injury to his R testicle.

## 2022-07-15 NOTE — ED Provider Notes (Signed)
[place] EMERGENCY DEPARTMENT AT [facility] Provider Note   CSN: [id number] Arrival date & time: 07/15/22  1349     History  Chief Complaint  Patient presents with   Testicle Pain    Matthew English is a 37 y.o. male.  Patient with noncontributory past medical history presents today with complaints of right testicle pain. He states that same occurred around 2 am this morning when he was having sexual intercourse. He states that he was having rough intercourse 'doggie style' and had his right leg in the air and he crushed his testicle between himself and his partner causing pain. He states that the pain originally was mild and so he thought it would be fine and subsequently went to sleep. States he woke up this morning and noticed that his testicle was swollen and more tender. He denies fevers, chills, nausea, vomiting, diarrhea. Denies penile pain, penile discharge hematuria or dysuria.  The history is provided by the patient. No language interpreter was used.  Testicle Pain       Home Medications Prior to Admission medications   Not on File      Allergies    Patient has no known allergies.    Review of Systems   Review of Systems  Genitourinary:  Positive for testicular pain.  All other systems reviewed and are negative.   Physical Exam Updated Vital Signs BP 125/81   Pulse 96   Temp 98.3 F (36.8 C) (Oral)   Resp 16   SpO2 96%  Physical Exam Vitals and nursing note reviewed.  Constitutional:      General: He is not in acute distress.    Appearance: Normal appearance. He is normal weight. He is not ill-appearing, toxic-appearing or diaphoretic.  HENT:     Head: Normocephalic and atraumatic.  Cardiovascular:     Rate and Rhythm: Normal rate.  Pulmonary:     Effort: Pulmonary effort is normal. No respiratory distress.  Genitourinary:    Comments: Mild swelling noted to the right testicle with tenderness to palpation. No palpable masses or other  abnormalities. No overlying skin changes Musculoskeletal:        General: Normal range of motion.     Cervical back: Normal range of motion.  Skin:    General: Skin is warm and dry.  Neurological:     General: No focal deficit present.     Mental Status: He is alert.  Psychiatric:        Mood and Affect: Mood normal.        Behavior: Behavior normal.     ED Results / Procedures / Treatments   Labs (all labs ordered are listed, but only abnormal results are displayed) Labs Reviewed  URINALYSIS, ROUTINE W REFLEX MICROSCOPIC - Abnormal; Notable for the following components:      Result Value   Protein, ur 30 (*)    All other components within normal limits    EKG None  Radiology US SCROTUM W/DOPPLER  Result Date: 07/15/2022 CLINICAL DATA:  Right testicular pain. EXAM: SCROTAL ULTRASOUND DOPPLER ULTRASOUND OF THE TESTICLES TECHNIQUE: Complete ultrasound examination of the testicles, epididymis, and other scrotal structures was performed. Color and spectral Doppler ultrasound were also utilized to evaluate blood flow to the testicles. COMPARISON:  None Available. FINDINGS: Right testicle Measurements: 3.4 x 2.6 x 3.5 cm. No mass or microlithiasis visualized. Left testicle Measurements: 4.3 x 2.4 x 3.4 cm. No mass or microlithiasis visualized. Right epididymis:  Normal in size and  appearance. Left epididymis:  Normal in size and appearance. Hydrocele:  None visualized. Varicocele:  None visualized. Pulsed Doppler interrogation of both testes demonstrates normal low resistance arterial and venous waveforms bilaterally. IMPRESSION: No acute process. Electronically Signed   By: Lovey Newcomer M.D.   On: 07/15/2022 15:06    Procedures Procedures    Medications Ordered in ED Medications - No data to display  ED Course/ Medical Decision Making/ A&P                             Medical Decision Making  This patient is a 37 y.o. male who presents to the ED for concern of testicular pain,  this involves an extensive number of treatment options, and is a complaint that carries with it a high risk of complications and morbidity. The emergent differential diagnosis prior to evaluation includes, but is not limited to, testicular torsion, epididymitis, hydrocele, hernia, varicocele  This is not an exhaustive differential.   Past Medical History / Co-morbidities / Social History: N/A  Physical Exam: Physical exam performed. The pertinent findings include: Mild swelling noted to the right testicle with tenderness to palpation. No palpable masses or other abnormalities. No overlying skin changes  Lab Tests: I ordered, and personally interpreted labs.  The pertinent results include: UA with protein, no other acute findings   Imaging Studies: I ordered imaging studies including scrotal ultrasound. I independently visualized and interpreted imaging which showed no acute findings. I agree with the radiologist interpretation.   Disposition:  Patient presents today with complaints of right testicular pain. He is afebrile, nontoxic-appearing, and in no acute distress with reassuring vital signs. Physical exam reveals swelling and tenderness to the right testicle. US unremarkable, UA unremarkable. No signs of torsion, epididymitis, inguinal hernia, or other emergent concerns. He is stable for discharge. Will give urology referral for follow-up.  Patient is understanding and amenable with plan, educated on red flag symptoms that would prompt immediate return.  Patient discharged in stable condition.   Final Clinical Impression(s) / ED Diagnoses Final diagnoses:  Right testicular pain    Rx / DC Orders ED Discharge Orders     None     An After Visit Summary was printed and given to the patient.     Nestor Lewandowsky 07/15/22 1631    Lacretia Leigh, MD 07/16/22 1329

## 2022-07-15 NOTE — ED Provider Triage Note (Signed)
Emergency Medicine Provider Triage Evaluation Note  Matthew English , a 37 y.o. male  was evaluated in triage.  Pt complains of testicular pain.  Patient states that he was having sexual intercourse last night.  He notes that while performing the action, struck his right testicle and noted subsequent pain.  States that he woke up this morning and noticed swelling in right side of scrotum and increasing testicular pain prompting states emergency department.  Denies fever, penile pain, discharge, dysuria, hematuria..  Review of Systems  Positive: See above Negative:   Physical Exam  BP 134/73 (BP Location: Left Arm)   Pulse (!) 107   Temp 98.3 F (36.8 C) (Oral)   Resp 18   SpO2 100%  Gen:   Awake, no distress   Resp:  Normal effort  MSK:   Moves extremities without difficulty  Other:  No penile tenderness.  Tender palpation of right testicle.  Medical Decision Making  Medically screening exam initiated at 2:04 PM.  Appropriate orders placed.  Matthew English was informed that the remainder of the evaluation will be completed by another provider, this initial triage assessment does not replace that evaluation, and the importance of remaining in the ED until their evaluation is complete.     Wilnette Kales, [place] 07/15/22 1406

## 2022-07-15 NOTE — ED Notes (Signed)
Provided pt ice

## 2022-07-15 NOTE — Discharge Instructions (Addendum)
As we discussed, your workup in the ER today was reassuring for acute findings.  Ultrasound imaging did not reveal any emergent concerns.  I recommend that you wear compressive underwear and abstain from sexual intercourse while you are having pain.  Please take Tylenol/ibuprofen as needed for pain.  I have also given you a referral to urology with a number to call to schedule an appointment for continued evaluation and management of your symptoms.  Return if development of any new or worsening symptoms.

## 2022-12-10 ENCOUNTER — Other Ambulatory Visit: Payer: Self-pay | Admitting: Nurse Practitioner

## 2022-12-10 ENCOUNTER — Ambulatory Visit
Admission: RE | Admit: 2022-12-10 | Discharge: 2022-12-10 | Disposition: A | Discharge status: Home / Self Care | Payer: No Typology Code available for payment source | Source: Ambulatory Visit | Attending: Nurse Practitioner | Admitting: Nurse Practitioner

## 2022-12-10 DIAGNOSIS — Z021 Encounter for pre-employment examination: Secondary | ICD-10-CM

## 2023-03-02 ENCOUNTER — Emergency Department (HOSPITAL_BASED_OUTPATIENT_CLINIC_OR_DEPARTMENT_OTHER)
Admission: EM | Admit: 2023-03-02 | Discharge: 2023-03-02 | Disposition: A | Discharge status: Home / Self Care | Payer: Self-pay | Attending: Emergency Medicine | Admitting: Emergency Medicine

## 2023-03-02 ENCOUNTER — Other Ambulatory Visit: Payer: Self-pay

## 2023-03-02 ENCOUNTER — Encounter (HOSPITAL_BASED_OUTPATIENT_CLINIC_OR_DEPARTMENT_OTHER): Payer: Self-pay | Admitting: Emergency Medicine

## 2023-03-02 ENCOUNTER — Emergency Department (HOSPITAL_BASED_OUTPATIENT_CLINIC_OR_DEPARTMENT_OTHER): Payer: Self-pay | Admitting: Radiology

## 2023-03-02 DIAGNOSIS — J40 Bronchitis, not specified as acute or chronic: Secondary | ICD-10-CM | POA: Insufficient documentation

## 2023-03-02 DIAGNOSIS — Z20822 Contact with and (suspected) exposure to covid-19: Secondary | ICD-10-CM | POA: Insufficient documentation

## 2023-03-02 LAB — TROPONIN I (HIGH SENSITIVITY): Troponin I (High Sensitivity): <2 ng/L (ref <18)

## 2023-03-02 LAB — CBC WITH DIFFERENTIAL/PLATELET
Abs Immature Granulocytes: 0.01 10*3/uL (ref 0.00–0.07)
Basophils Absolute: 0 10*3/uL (ref 0.0–0.1)
Basophils Relative: 0 %
Eosinophils Absolute: 0.2 10*3/uL (ref 0.0–0.5)
Eosinophils Relative: 3 %
HCT: 40.7 % (ref 39.0–52.0)
Hemoglobin: 14 g/dL (ref 13.0–17.0)
Immature Granulocytes: 0 %
Lymphocytes Relative: 31 %
Lymphs Abs: 1.8 10*3/uL (ref 0.7–4.0)
MCH: 30.1 pg (ref 26.0–34.0)
MCHC: 34.4 g/dL (ref 30.0–36.0)
MCV: 87.5 fL (ref 80.0–100.0)
Monocytes Absolute: 0.3 10*3/uL (ref 0.1–1.0)
Monocytes Relative: 5 %
Neutro Abs: 3.5 10*3/uL (ref 1.7–7.7)
Neutrophils Relative %: 61 %
Platelets: 236 10*3/uL (ref 150–400)
RBC: 4.65 MIL/uL (ref 4.22–5.81)
RDW: 12.5 % (ref 11.5–15.5)
WBC: 5.7 10*3/uL (ref 4.0–10.5)
nRBC: 0 % (ref 0.0–0.2)

## 2023-03-02 LAB — BASIC METABOLIC PANEL
Anion gap: 9 (ref 5–15)
BUN: 18 mg/dL (ref 6–20)
CO2: 22 mmol/L (ref 22–32)
Calcium: 9 mg/dL (ref 8.9–10.3)
Chloride: 104 mmol/L (ref 98–111)
Creatinine, Ser: 0.74 mg/dL (ref 0.61–1.24)
GFR, Estimated: >60 mL/min (ref >60)
Glucose, Bld: 101 mg/dL — ABNORMAL HIGH (ref 70–99)
Potassium: 3.9 mmol/L (ref 3.5–5.1)
Sodium: 135 mmol/L (ref 135–145)

## 2023-03-02 LAB — RESP PANEL BY RT-PCR (RSV, FLU A&B, COVID)  RVPGX2
Influenza A by PCR: NEGATIVE
Influenza B by PCR: NEGATIVE
Resp Syncytial Virus by PCR: NEGATIVE
SARS Coronavirus 2 by RT PCR: NEGATIVE

## 2023-03-02 LAB — D-DIMER, QUANTITATIVE: D-Dimer, Quant: <0.27 ug{FEU}/mL (ref 0.00–0.50)

## 2023-03-02 MED ORDER — AZITHROMYCIN 250 MG PO TABS
250.0000 mg | ORAL_TABLET | Freq: Every day | ORAL | 0 refills | Status: AC
Start: 2023-03-02 — End: ?

## 2023-03-02 MED ORDER — ALBUTEROL SULFATE HFA 108 (90 BASE) MCG/ACT IN AERS
1.0000 | INHALATION_SPRAY | Freq: Four times a day (QID) | RESPIRATORY_TRACT | 0 refills | Status: AC | PRN
Start: 2023-03-02 — End: ?

## 2023-03-02 NOTE — Discharge Instructions (Addendum)
It was a pleasure taking care of you here in the emergency department.  Most likely have infectious bronchitis.  We have started you on antibiotics as well as an albuterol inhaler.  Use albuterol inhaler as needed for shortness of breath.  Take the antibiotics as prescribed.  Tylenol Motrin as needed for pain  Return for any worsening symptoms

## 2023-03-02 NOTE — ED Provider Notes (Signed)
[place] EMERGENCY DEPARTMENT AT [facility] Provider Note   CSN: [id number] Arrival date & time: 03/02/23  1749     History  Chief Complaint  Patient presents with   Cough    Matthew English is a 37 y.o. male here for evaluation of cough, chest pain.  Patient states he has had cough, wheeze, diffuse chest pain, worse with deep breathing and coughing over the last week or so.  Has had multiple sick contacts.  No fever, congestion or rhinorrhea.  Significant other room states his wife "rattles" and "wheezes", worse at night.  Cough productive of green sputum.  No pain or swelling to lower extremities.  No history of PE or DVT.  No recent surgery, immobilization or malignancy.  No meds PTA.  HPI     Home Medications Prior to Admission medications   Medication Sig Start Date End Date Taking? Authorizing Provider  albuterol (VENTOLIN HFA) 108 (90 Base) MCG/ACT inhaler Inhale 1-2 puffs into the lungs every 6 (six) hours as needed for wheezing or shortness of breath. 03/02/23  Yes Rayan Ines A, PA-C  azithromycin (ZITHROMAX) 250 MG tablet Take 1 tablet (250 mg total) by mouth daily. Take first 2 tablets together, then 1 every day until finished. 03/02/23  Yes Sonoma Firkus A, PA-C      Allergies    Patient has no known allergies.    Review of Systems   Review of Systems  Constitutional:  Positive for chills.  HENT: Negative.    Respiratory:  Positive for cough and shortness of breath. Negative for apnea, choking, chest tightness, wheezing and stridor.   Cardiovascular:  Positive for chest pain. Negative for palpitations and leg swelling.  Gastrointestinal: Negative.   Genitourinary: Negative.   Musculoskeletal: Negative.   Skin: Negative.   Neurological: Negative.   All other systems reviewed and are negative.   Physical Exam Updated Vital Signs BP (!) 117/92   Pulse 76   Temp 98 F (36.7 C)   Resp (!) 22   SpO2 100%  Physical Exam Vitals and nursing  note reviewed.  Constitutional:      General: He is not in acute distress.    Appearance: He is well-developed. He is not ill-appearing, toxic-appearing or diaphoretic.  HENT:     Head: Normocephalic and atraumatic.     Nose: Nose normal.     Mouth/Throat:     Mouth: Mucous membranes are moist.  Eyes:     Pupils: Pupils are equal, round, and reactive to light.  Cardiovascular:     Rate and Rhythm: Normal rate and regular rhythm.     Pulses: Normal pulses.          Radial pulses are 2+ on the right side and 2+ on the left side.       Dorsalis pedis pulses are 2+ on the right side and 2+ on the left side.     Heart sounds: Normal heart sounds.  Pulmonary:     Effort: Pulmonary effort is normal. No respiratory distress.     Breath sounds: Normal breath sounds.     Comments: Minimal rhonchi and wheeze, clears with cough to left lower lung Abdominal:     General: Bowel sounds are normal. There is no distension.     Palpations: Abdomen is soft.     Tenderness: There is no abdominal tenderness. There is no right CVA tenderness or left CVA tenderness.  Musculoskeletal:        General: Normal range of  motion.     Cervical back: Normal range of motion and neck supple.     Right lower leg: No edema.     Left lower leg: No edema.     Comments: Lower extremity soft, nontender.  Compartments soft  Skin:    General: Skin is warm and dry.     Capillary Refill: Capillary refill takes less than 2 seconds.     Comments: No obvious rashes or lesions  Neurological:     General: No focal deficit present.     Mental Status: He is alert and oriented to person, place, and time.     ED Results / Procedures / Treatments   Labs (all labs ordered are listed, but only abnormal results are displayed) Labs Reviewed  BASIC METABOLIC PANEL - Abnormal; Notable for the following components:      Result Value   Glucose, Bld 101 (*)    All other components within normal limits  RESP PANEL BY RT-PCR (RSV,  FLU A&B, COVID)  [id number]  D-DIMER, QUANTITATIVE  CBC WITH DIFFERENTIAL/PLATELET  CBC WITH DIFFERENTIAL/PLATELET  TROPONIN I (HIGH SENSITIVITY)    EKG EKG Interpretation Date/Time:  Saturday March 02 2023 18:50:14 EDT Ventricular Rate:  72 PR Interval:  155 QRS Duration:  95 QT Interval:  361 QTC Calculation: 395 R Axis:   42  Text Interpretation: Sinus rhythm Consider left atrial enlargement No significant change since last tracing Confirmed by Elayne Snare ([id number]) on 03/02/2023 7:23:07 PM  Radiology DG Chest 2 View  Result Date: 03/02/2023 CLINICAL DATA:  Wet cough chest tightness EXAM: CHEST - 2 VIEW COMPARISON:  12/10/2022 FINDINGS: Central airways thickening. No consolidation or effusion. Normal cardiac size. No pneumothorax IMPRESSION: Central airways thickening which may be due to bronchitis or reactive airways, no focal airspace disease is seen. Electronically Signed   By: Jasmine Pang M.D.   On: 03/02/2023 19:47    Procedures Procedures    Medications Ordered in ED Medications - No data to display  ED Course/ Medical Decision Making/ A&P   37 year old here for evaluation of cough, shortness of breath and chest pain.  He is afebrile, nonseptic, not ill-appearing.  Initially on arrival he had some tachycardia at 101 which is resolved.  He is no clinical evidence of VTE.  Unable to [facility] due to tachycardia.  Chest pain worse with deep breathing, coughing.  Family states he has been having "wheezing and rattling" at night.  Feels like when he has had pneumonia previously.  He has had multiple sick contacts.  Heart clear.  Lungs with some mild wheeze and rhonchi, clears with cough to left lung.  He speaks in full sentences without difficulty.  His abdomen is soft, tender.  Will plan on labs, imaging and reassess  Labs and imaging personally viewed and interpreted:  CBC without leukocytosis COVID, flu, RSV negative Troponin <2 CBC no leukocytosis BMP glucose  101 D-dimer <0.27 Chest x-ray shows bronchitis versus reactive airway disease EKG without ischemic changes  Patient reassessed.  Feels okay.  Will treat for bronchitis.  Will have him follow-up, return for any worsening symptoms.  At this time I have low suspicion for acute ACS, PE, dissection, sepsis  The patient has been appropriately medically screened and/or stabilized in the ED. I have low suspicion for any other emergent medical condition which would require further screening, evaluation or treatment in the ED or require inpatient management.  Patient is hemodynamically stable and in no acute distress.  Patient able to  ambulate in department prior to ED.  Evaluation does not show acute pathology that would require ongoing or additional emergent interventions while in the emergency department or further inpatient treatment.  I have discussed the diagnosis with the patient and answered all questions.  Pain is been managed while in the emergency department and patient has no further complaints prior to discharge.  Patient is comfortable with plan discussed in room and is stable for discharge at this time.  I have discussed strict return precautions for returning to the emergency department.  Patient was encouraged to follow-up with PCP/specialist refer to at discharge.                                 Medical Decision Making Amount and/or Complexity of Data Reviewed Independent Historian: spouse External Data Reviewed: labs, radiology, ECG and notes. Labs: ordered. Decision-making details documented in ED Course. Radiology: ordered and independent interpretation performed. Decision-making details documented in ED Course. ECG/medicine tests: ordered and independent interpretation performed. Decision-making details documented in ED Course.  Risk OTC drugs. Prescription drug management. Decision regarding hospitalization. Diagnosis or treatment significantly limited by social determinants  of health.          Final Clinical Impression(s) / ED Diagnoses Final diagnoses:  Bronchitis    Rx / DC Orders ED Discharge Orders          Ordered    azithromycin (ZITHROMAX) 250 MG tablet  Daily        03/02/23 2140    albuterol (VENTOLIN HFA) 108 (90 Base) MCG/ACT inhaler  Every 6 hours PRN        03/02/23 2140              Vibha Ferdig A, PA-C 03/02/23 2143    Elayne Snare K, DO 03/02/23 2328

## 2023-03-02 NOTE — ED Notes (Signed)
Patient transported to X-ray 

## 2023-03-02 NOTE — ED Triage Notes (Signed)
Wet cough, chest tightness for about a week.  Exposure to sick family members

## 2023-09-23 NOTE — Telephone Encounter (Signed)
 Matthew English

## 2024-07-14 ENCOUNTER — Encounter (HOSPITAL_BASED_OUTPATIENT_CLINIC_OR_DEPARTMENT_OTHER): Payer: Self-pay

## 2024-07-14 ENCOUNTER — Other Ambulatory Visit: Payer: Self-pay

## 2024-07-14 ENCOUNTER — Emergency Department (HOSPITAL_BASED_OUTPATIENT_CLINIC_OR_DEPARTMENT_OTHER)
Admission: EM | Admit: 2024-07-14 | Discharge: 2024-07-14 | Disposition: A | Discharge status: Home / Self Care | Payer: Self-pay | Attending: Emergency Medicine | Admitting: Emergency Medicine

## 2024-07-14 ENCOUNTER — Emergency Department (HOSPITAL_BASED_OUTPATIENT_CLINIC_OR_DEPARTMENT_OTHER): Payer: Self-pay

## 2024-07-14 DIAGNOSIS — K5732 Diverticulitis of large intestine without perforation or abscess without bleeding: Secondary | ICD-10-CM | POA: Insufficient documentation

## 2024-07-14 DIAGNOSIS — K5792 Diverticulitis of intestine, part unspecified, without perforation or abscess without bleeding: Secondary | ICD-10-CM

## 2024-07-14 LAB — RESP PANEL BY RT-PCR (RSV, FLU A&B, COVID)  RVPGX2
Influenza A by PCR: NEGATIVE
Influenza B by PCR: NEGATIVE
Resp Syncytial Virus by PCR: NEGATIVE
SARS Coronavirus 2 by RT PCR: NEGATIVE

## 2024-07-14 LAB — COMPREHENSIVE METABOLIC PANEL WITH GFR
ALT: 30 U/L (ref 0–44)
AST: 21 U/L (ref 15–41)
Albumin: 4.7 g/dL (ref 3.5–5.0)
Alkaline Phosphatase: 83 U/L (ref 38–126)
Anion gap: 12 (ref 5–15)
BUN: 14 mg/dL (ref 6–20)
CO2: 26 mmol/L (ref 22–32)
Calcium: 9.8 mg/dL (ref 8.9–10.3)
Chloride: 104 mmol/L (ref 98–111)
Creatinine, Ser: 0.87 mg/dL (ref 0.61–1.24)
GFR, Estimated: >60 mL/min
Glucose, Bld: 115 mg/dL — ABNORMAL HIGH (ref 70–99)
Potassium: 4.2 mmol/L (ref 3.5–5.1)
Sodium: 142 mmol/L (ref 135–145)
Total Bilirubin: 0.3 mg/dL (ref 0.0–1.2)
Total Protein: 6.9 g/dL (ref 6.5–8.1)

## 2024-07-14 LAB — URINALYSIS, ROUTINE W REFLEX MICROSCOPIC
Bilirubin Urine: NEGATIVE
Glucose, UA: NEGATIVE mg/dL
Hgb urine dipstick: NEGATIVE
Ketones, ur: NEGATIVE mg/dL
Leukocytes,Ua: NEGATIVE
Nitrite: NEGATIVE
Specific Gravity, Urine: 1.027 (ref 1.005–1.030)
pH: 7.5 (ref 5.0–8.0)

## 2024-07-14 LAB — CBC WITH DIFFERENTIAL/PLATELET
Abs Immature Granulocytes: 0.01 K/uL (ref 0.00–0.07)
Basophils Absolute: 0 K/uL (ref 0.0–0.1)
Basophils Relative: 0 %
Eosinophils Absolute: 0.1 K/uL (ref 0.0–0.5)
Eosinophils Relative: 2 %
HCT: 43.2 % (ref 39.0–52.0)
Hemoglobin: 14.8 g/dL (ref 13.0–17.0)
Immature Granulocytes: 0 %
Lymphocytes Relative: 36 %
Lymphs Abs: 2.1 K/uL (ref 0.7–4.0)
MCH: 30.7 pg (ref 26.0–34.0)
MCHC: 34.3 g/dL (ref 30.0–36.0)
MCV: 89.6 fL (ref 80.0–100.0)
Monocytes Absolute: 0.3 K/uL (ref 0.1–1.0)
Monocytes Relative: 5 %
Neutro Abs: 3.3 K/uL (ref 1.7–7.7)
Neutrophils Relative %: 57 %
Platelets: 238 K/uL (ref 150–400)
RBC: 4.82 MIL/uL (ref 4.22–5.81)
RDW: 12.6 % (ref 11.5–15.5)
WBC: 5.9 K/uL (ref 4.0–10.5)
nRBC: 0 % (ref 0.0–0.2)

## 2024-07-14 LAB — LIPASE, BLOOD: Lipase: 28 U/L (ref 11–51)

## 2024-07-14 MED ORDER — IOHEXOL 300 MG/ML  SOLN
100.0000 mL | Freq: Once | INTRAMUSCULAR | Status: AC | PRN
  Administered 2024-07-14: 100 mL via INTRAVENOUS

## 2024-07-14 MED ORDER — AMOXICILLIN-POT CLAVULANATE 875-125 MG PO TABS: 1.0000 | ORAL_TABLET | Freq: Two times a day (BID) | ORAL | 0 refills | Status: AC

## 2024-07-14 MED ORDER — ONDANSETRON HCL 4 MG PO TABS: 4.0000 mg | ORAL_TABLET | Freq: Four times a day (QID) | ORAL | 0 refills | Status: AC

## 2024-07-14 MED ORDER — AMOXICILLIN-POT CLAVULANATE 875-125 MG PO TABS
1.0000 | ORAL_TABLET | Freq: Once | ORAL | Status: AC
  Administered 2024-07-14: 1 via ORAL
  Filled 2024-07-14: qty 1

## 2024-07-14 MED ORDER — ONDANSETRON HCL 4 MG/2ML IJ SOLN
4.0000 mg | Freq: Once | INTRAMUSCULAR | Status: AC
  Administered 2024-07-14: 4 mg via INTRAVENOUS
  Filled 2024-07-14: qty 2

## 2024-07-14 NOTE — ED Notes (Signed)
 Pt to CT via w/c, stable upon departure.

## 2024-07-14 NOTE — ED Provider Notes (Signed)
 " [place] [facility] AT [facility] Provider Note   CSN: [id number] Arrival date & time: 07/14/24  2035     Patient presents with: Emesis   Matthew English is a 39 y.o. male.   Patient is a 39 year old male with history of prior diverticulitis presenting to the emergency department with abdominal pain and vomiting.  Patient states that he has had nausea and vomiting with diarrhea for about the last 2 weeks.  He states over the last few days he started to develop generalized abdominal pain and bloating that starting to radiate into his left lower quadrant.  He states that he felt chilled last night but has not had any measured fevers at home.  The history is provided by the patient.  Emesis      Prior to Admission medications  Medication Sig Start Date End Date Taking? Authorizing Provider  amoxicillin -clavulanate (AUGMENTIN ) 875-125 MG tablet Take 1 tablet by mouth every 12 (twelve) hours. 07/14/24  Yes Ellouise, Aniko Finnigan K, DO  ondansetron  (ZOFRAN ) 4 MG tablet Take 1 tablet (4 mg total) by mouth every 6 (six) hours. 07/14/24  Yes Ellouise, Jennette Leask K, DO  albuterol  (VENTOLIN  HFA) 108 (90 Base) MCG/ACT inhaler Inhale 1-2 puffs into the lungs every 6 (six) hours as needed for wheezing or shortness of breath. 03/02/23   Henderly, Britni A, PA-C  azithromycin  (ZITHROMAX ) 250 MG tablet Take 1 tablet (250 mg total) by mouth daily. Take first 2 tablets together, then 1 every day until finished. 03/02/23   Henderly, Britni A, PA-C    Allergies: Patient has no known allergies.    Review of Systems  Gastrointestinal:  Positive for vomiting.    Updated Vital Signs BP (!) 142/92   Pulse 91   Temp 98.8 F (37.1 C)   Resp 18   Ht 6' 2 (1.88 m)   Wt 113.4 kg   SpO2 99%   BMI 32.10 kg/m   Physical Exam Vitals and nursing note reviewed.  Constitutional:      General: He is not in acute distress.    Appearance: Normal appearance.  HENT:     Head: Normocephalic and  atraumatic.     Nose: Nose normal.     Mouth/Throat:     Mouth: Mucous membranes are moist.  Eyes:     Extraocular Movements: Extraocular movements intact.     Conjunctiva/sclera: Conjunctivae normal.  Cardiovascular:     Rate and Rhythm: Normal rate and regular rhythm.     Heart sounds: Normal heart sounds.  Pulmonary:     Effort: Pulmonary effort is normal.     Breath sounds: Normal breath sounds.  Abdominal:     General: Abdomen is flat.     Palpations: Abdomen is soft.     Tenderness: There is abdominal tenderness (Generalized, worst in left lower quadrant). There is no guarding or rebound.  Musculoskeletal:        General: Normal range of motion.     Cervical back: Normal range of motion.  Skin:    General: Skin is warm and dry.  Neurological:     General: No focal deficit present.     Mental Status: He is alert and oriented to person, place, and time.  Psychiatric:        Mood and Affect: Mood normal.        Behavior: Behavior normal.     (all labs ordered are listed, but only abnormal results are displayed) Labs Reviewed  COMPREHENSIVE METABOLIC PANEL WITH  GFR - Abnormal; Notable for the following components:      Result Value   Glucose, Bld 115 (*)    All other components within normal limits  URINALYSIS, ROUTINE W REFLEX MICROSCOPIC - Abnormal; Notable for the following components:   Protein, ur TRACE (*)    All other components within normal limits  RESP PANEL BY RT-PCR (RSV, FLU A&B, COVID)  [id number]  CBC WITH DIFFERENTIAL/PLATELET  LIPASE, BLOOD    EKG: None  Radiology: CT ABDOMEN PELVIS W CONTRAST Result Date: 07/14/2024 EXAM: CT ABDOMEN AND PELVIS WITH CONTRAST 07/14/2024 10:40:03 PM TECHNIQUE: CT of the abdomen and pelvis was performed with the administration of intravenous contrast. Multiplanar reformatted images are provided for review. Automated exposure control, iterative reconstruction, and/or weight-based adjustment of the mA/kV was utilized to  reduce the radiation dose to as low as reasonably achievable. COMPARISON: 09/23/2021 CLINICAL HISTORY: LLQ abdominal pain FINDINGS: LOWER CHEST: No acute abnormality. LIVER: The liver is unremarkable. GALLBLADDER AND BILE DUCTS: Gallbladder is unremarkable. No biliary ductal dilatation. SPLEEN: No acute abnormality. PANCREAS: No acute abnormality. ADRENAL GLANDS: No acute abnormality. KIDNEYS, URETERS AND BLADDER: No stones in the kidneys or ureters. No hydronephrosis. No perinephric or periureteral stranding. Urinary bladder is unremarkable. GI AND BOWEL: Normal appendix (image 50). Sigmoid diverticulosis with mild pericolonic inflammatory stranding in the left lower quadrant (image 61) reflecting mild sigmoid diverticulitis. No drainable fluid collection/abscess. No free air. PERITONEUM AND RETROPERITONEUM: No ascites. No free air. VASCULATURE: Aorta is normal in caliber. LYMPH NODES: No lymphadenopathy. REPRODUCTIVE ORGANS: The prostate is unremarkable. BONES AND SOFT TISSUES: No acute osseous abnormality. Minimal fat in the bilateral inguinal canals. IMPRESSION: 1. Mild sigmoid diverticulitis. No drainable fluid collection/abscess or free air. Electronically signed by: Pinkie Pebbles MD 07/14/2024 10:45 PM EST RP Workstation: [id number]     Procedures   Medications Ordered in the ED  amoxicillin -clavulanate (AUGMENTIN ) 875-125 MG per tablet 1 tablet (has no administration in time range)  ondansetron  (ZOFRAN ) injection 4 mg (4 mg Intravenous Given 07/14/24 2100)  iohexol  (OMNIPAQUE ) 300 MG/ML solution 100 mL (100 mLs Intravenous Contrast Given 07/14/24 2240)    Clinical Course as of 07/14/24 2315  Tue Jul 14, 2024  2305 CTAP with findings of diverticulitis without complications. Will treat with antibiotics. Symptoms controlled at this time and can follow up outpatient.  [VK]    Clinical Course User Index [VK] Kingsley, Vivian Okelley K, DO                                 Medical Decision  Making This patient presents to the ED with chief complaint(s) of abdominal pain with pertinent past medical history of diverticulosis which further complicates the presenting complaint. The complaint involves an extensive differential diagnosis and also carries with it a high risk of complications and morbidity.    The differential diagnosis includes diverticulitis, gastroenteritis, dehydration, electrolyte abnormality, colitis, other intra-abdominal infection, viral syndrome  Additional history obtained: Additional history obtained from N/A Records reviewed outpatient GI records  ED Course and Reassessment: On patient's arrival he is hemodynamically stable in no acute distress.  He had labs initiated in triage that are within normal range including negative viral swab.  With patient's pain and history of diverticulitis, will have CT abdomen and pelvis.  He was given Zofran  in triage with improvement of his nausea and declined any pain control at this time.  Independent labs interpretation:  The following labs were  independently interpreted: Within normal range  Independent visualization of imaging: - I independently visualized the following imaging with scope of interpretation limited to determining acute life threatening conditions related to emergency care: CTAP, which revealed acute diverticulitis without complication  Consultation: - Consulted or discussed management/test interpretation w/ external professional: N/A  Consideration for admission or further workup: Patient has no emergent conditions requiring admission or further work-up at this time and is stable for discharge home with primary care and GI follow-up  Social Determinants of health: N/A    Amount and/or Complexity of Data Reviewed Labs: ordered. Radiology: ordered.  Risk Prescription drug management.       Final diagnoses:  Diverticulitis    ED Discharge Orders          Ordered     amoxicillin -clavulanate (AUGMENTIN ) 875-125 MG tablet  Every 12 hours        07/14/24 2312    ondansetron  (ZOFRAN ) 4 MG tablet  Every 6 hours        07/14/24 2312               Kingsley, Hadriel Northup K, DO 07/14/24 2315  "

## 2024-07-14 NOTE — Discharge Instructions (Addendum)
 You were seen in the emergency department for your abdominal pain and nausea.  You do have diverticulitis but had no complications on your scan.  I have given you a course of antibiotics and you should complete this as prescribed.  You can take Tylenol  every 6 hours as needed for pain and I have given you Zofran  to take as needed for nausea.  You can follow-up with your primary doctor in the next few days to have your symptoms rechecked and you should call GI to schedule a follow-up colonoscopy once you have recovered from the diverticulitis.  You should return to the emergency department if you having fevers despite the antibiotics, worsening pain or any other new or concerning symptoms.

## 2024-07-14 NOTE — ED Triage Notes (Signed)
 Vomiting x 1 week Hx of diverticulitis Has been able to drink small amounts, but can not keep food down +abdominal pain and feel bloated No appetite

## 2024-08-27 ENCOUNTER — Ambulatory Visit: Payer: Self-pay | Admitting: Gastroenterology
